# Patient Record
Sex: Male | Born: 1975 | Race: Black or African American | Hispanic: No | Smoking: Current some day smoker
Health system: Southern US, Community
[De-identification: ages and names within clinical notes are randomized; demographics above are authoritative.]

## PROBLEM LIST (undated history)

## (undated) HISTORY — PX: NO PAST SURGERIES: SHX2092

---

## 2000-10-25 ENCOUNTER — Emergency Department (HOSPITAL_COMMUNITY): Admission: EM | Admit: 2000-10-25 | Discharge: 2000-10-25 | Payer: Self-pay | Admitting: Emergency Medicine

## 2010-09-22 ENCOUNTER — Emergency Department (HOSPITAL_COMMUNITY)
Admission: EM | Admit: 2010-09-22 | Discharge: 2010-09-22 | Disposition: A | Discharge status: Home / Self Care | Payer: Worker's Compensation | Attending: Emergency Medicine | Admitting: Emergency Medicine

## 2010-09-22 DIAGNOSIS — Y990 Civilian activity done for income or pay: Secondary | ICD-10-CM | POA: Insufficient documentation

## 2010-09-22 DIAGNOSIS — W268XXA Contact with other sharp object(s), not elsewhere classified, initial encounter: Secondary | ICD-10-CM | POA: Insufficient documentation

## 2010-09-22 DIAGNOSIS — S61209A Unspecified open wound of unspecified finger without damage to nail, initial encounter: Secondary | ICD-10-CM | POA: Insufficient documentation

## 2010-09-29 ENCOUNTER — Emergency Department (HOSPITAL_COMMUNITY)
Admission: EM | Admit: 2010-09-29 | Discharge: 2010-09-29 | Disposition: A | Discharge status: Home / Self Care | Payer: Self-pay | Attending: Emergency Medicine | Admitting: Emergency Medicine

## 2010-09-29 DIAGNOSIS — Z4802 Encounter for removal of sutures: Secondary | ICD-10-CM | POA: Insufficient documentation

## 2016-07-12 ENCOUNTER — Emergency Department (HOSPITAL_COMMUNITY)
Admission: EM | Admit: 2016-07-12 | Discharge: 2016-07-12 | Disposition: A | Discharge status: Home / Self Care | Payer: BLUE CROSS/BLUE SHIELD | Attending: Emergency Medicine | Admitting: Emergency Medicine

## 2016-07-12 ENCOUNTER — Encounter (HOSPITAL_COMMUNITY): Payer: Self-pay | Admitting: Emergency Medicine

## 2016-07-12 DIAGNOSIS — M545 Low back pain, unspecified: Secondary | ICD-10-CM

## 2016-07-12 DIAGNOSIS — X509XXA Other and unspecified overexertion or strenuous movements or postures, initial encounter: Secondary | ICD-10-CM | POA: Insufficient documentation

## 2016-07-12 DIAGNOSIS — Y999 Unspecified external cause status: Secondary | ICD-10-CM | POA: Diagnosis not present

## 2016-07-12 DIAGNOSIS — Y9389 Activity, other specified: Secondary | ICD-10-CM | POA: Diagnosis not present

## 2016-07-12 DIAGNOSIS — Y929 Unspecified place or not applicable: Secondary | ICD-10-CM | POA: Diagnosis not present

## 2016-07-12 MED ORDER — IBUPROFEN 800 MG PO TABS: 800.0000 mg | ORAL_TABLET | Freq: Three times a day (TID) | ORAL | 0 refills | Status: DC

## 2016-07-12 MED ORDER — LIDOCAINE 5 % EX PTCH: 1.0000 | MEDICATED_PATCH | CUTANEOUS | 0 refills | Status: DC

## 2016-07-12 MED ORDER — METHOCARBAMOL 500 MG PO TABS: 500.0000 mg | ORAL_TABLET | Freq: Two times a day (BID) | ORAL | 0 refills | Status: DC

## 2016-07-12 NOTE — ED Notes (Signed)
Pt asked for water. Pt informed that he has to wait to see a Provider.

## 2016-07-12 NOTE — ED Provider Notes (Signed)
[facility]-EMERGENCY DEPT Provider Note   CSN: [id number] Arrival date & time: 07/12/16  1021  By signing my name below, I, [name], attest that this documentation has been prepared under the direction and in the presence of [name], PA-C. Electronically Signed: [name], Neurosurgeon. 07/12/16. 12:31 PM.  History   Chief Complaint Chief Complaint  Patient presents with  . Back Pain    The history is provided by the patient. No language interpreter was used.     HPI Comments: Noah Hardin is a 41 y.o. male who presents to the Emergency Department complaining of right lower back pain that has gradually worsened over the last few weeks. He works for [employer] and believes he initially injured his back lifting a heavy box. He states since then he has had several long distance car rides that he believes has worsened his back pain. He describes the pain as tightness in nature. Patient has tried occasional ibuprofen as well as cold and hot therapy. Ibuprofen offered some relief, however, patient states he does not like taking medications.  Patient also endorses intermittent cough as well as congestion for the past few days. He denies urinary complaints, shortness of breath, chest pain changes in bowel or bladder function, neuro deficits, or any other complaints.   He denies DM, alcoholism, HIV, IV drug use, history of spinal procedures, or history of skin infection.  History reviewed. No pertinent past medical history.  There are no active problems to display for this patient.   History reviewed. No pertinent surgical history.     Home Medications    Prior to Admission medications   Medication Sig Start Date End Date Taking? Authorizing Provider  ibuprofen (ADVIL,MOTRIN) 800 MG tablet Take 1 tablet (800 mg total) by mouth 3 (three) times daily. 07/12/16   Alonda Weaber C Lewie Deman, PA-C  lidocaine (LIDODERM) 5 % Place 1 patch onto the skin daily. Remove & Discard patch within 12 hours or as directed by MD  07/12/16   Anselm Pancoast, PA-C  methocarbamol (ROBAXIN) 500 MG tablet Take 1 tablet (500 mg total) by mouth 2 (two) times daily. 07/12/16   Anselm Pancoast, PA-C    Family History History reviewed. No pertinent family history.  Social History Social History  Substance Use Topics  . Smoking status: Not on file  . Smokeless tobacco: Not on file  . Alcohol use Not on file     Allergies   Patient has no known allergies.   Review of Systems Review of Systems  HENT: Positive for congestion.   Respiratory: Positive for cough. Negative for shortness of breath.   Gastrointestinal: Negative for nausea and vomiting.  Musculoskeletal: Positive for back pain. Negative for neck pain and neck stiffness.  Skin: Negative for rash.  Neurological: Negative for weakness, numbness and headaches.  All other systems reviewed and are negative.    Physical Exam Updated Vital Signs BP 140/97 (BP Location: Left Arm)   Pulse 99   Temp 100.6 F (38.1 C) (Oral)   Resp 18   Ht 6\' 1"  (1.854 m)   Wt 160 lb (72.6 kg)   SpO2 100%   BMI 21.11 kg/m   Physical Exam  Constitutional: He appears well-developed and well-nourished. No distress.  Patient is smiling and talkative.  HENT:  Head: Normocephalic and atraumatic.  Mouth/Throat: Oropharynx is clear and moist.  Eyes: Conjunctivae are normal.  Neck: Normal range of motion. Neck supple.  No meningismus signs.  Cardiovascular: Normal rate, regular rhythm, normal  heart sounds and intact distal pulses.   Pulmonary/Chest: Effort normal and breath sounds normal. No respiratory distress.  Abdominal: Soft. There is no tenderness. There is no guarding.  Musculoskeletal: Normal range of motion. He exhibits tenderness. He exhibits no edema.  Tenderness to the right lumbar musculature into the right sacral musculature and buttocks. Normal motor function intact in all extremities and spine. No midline spinal tenderness.    Lymphadenopathy:    He has no cervical  adenopathy.  Neurological: He is alert.  No sensory deficits. Strength 5/5 in all extremities. No gait disturbance. Coordination intact including heel to shin and finger to nose.   Skin: Skin is warm and dry. No rash noted. He is not diaphoretic. No erythema.  Patient skin checked for sources of possible infection with no abnormalities found.  Psychiatric: He has a normal mood and affect. His behavior is normal.  Nursing note and vitals reviewed.    ED Treatments / Results  DIAGNOSTIC STUDIES: Oxygen Saturation is 100% on RA, nml by my interpretation.    COORDINATION OF CARE: 12:34 PM Discussed treatment plan with pt at bedside and pt agreed to plan.    Labs (all labs ordered are listed, but only abnormal results are displayed) Labs Reviewed - No data to display  EKG  EKG Interpretation None       Radiology No results found.  Procedures Procedures (including critical care time)  Medications Ordered in ED Medications - No data to display   Initial Impression / Assessment and Plan / ED Course  I have reviewed the triage vital signs and the nursing notes.  Pertinent labs & imaging results that were available during my care of the patient were reviewed by me and considered in my medical decision making (see chart for details).  Clinical Course     Patient presents with lower back pain linked to a possible lifting injury. Patient's symptoms are consistent with muscle strain. He has no functional or neuro deficits. Very low suspicion for epidural abscess due to lack of risk factors and lack of supportive findings on physical exam. Presence of the patient's fever is odd, but may be connected with his URI symptoms. The fever was discussed with the patient. He declined medication in the ED for fever control. He states he is comfortable with managing his fever at home. He is given instructions for home care of his symptoms as well as strict return precautions. Patient voices  understanding of all instructions and is comfortable with discharge.  Vitals:   07/12/16 1052 07/12/16 1246  BP: 140/97 132/67  Pulse: 99 102  Resp: 18 16  Temp: 100.6 F (38.1 C) 101.7 F (38.7 C)  TempSrc: Oral Oral  SpO2: 100% 100%  Weight: 72.6 kg   Height: 6\' 1"  (1.854 m)      Final Clinical Impressions(s) / ED Diagnoses   Final diagnoses:  Acute bilateral low back pain without sciatica    New Prescriptions Discharge Medication List as of 07/12/2016 12:37 PM    START taking these medications   Details  ibuprofen (ADVIL,MOTRIN) 800 MG tablet Take 1 tablet (800 mg total) by mouth 3 (three) times daily., Starting Mon 07/12/2016, Print    lidocaine (LIDODERM) 5 % Place 1 patch onto the skin daily. Remove & Discard patch within 12 hours or as directed by MD, Starting Mon 07/12/2016, Print    methocarbamol (ROBAXIN) 500 MG tablet Take 1 tablet (500 mg total) by mouth 2 (two) times daily., Starting Mon 07/12/2016,  Print       I personally performed the services described in this documentation, which was scribed in my presence. The recorded information has been reviewed and is accurate.    Anselm Pancoast, PA-C 07/13/16 1610    Canary Brim Tegeler, MD 07/13/16 [phone]

## 2016-07-12 NOTE — ED Triage Notes (Signed)
Pt from home with c/o lower back pain after [place] [facility] [place] [place] his grandmother was sick x 3 weeks ago.  Pt has tried some OTC NSAIDS but does not like taking pain medication and has tried both hot and cold therapy.  Pt denies bowel or bladder incontinence.  Ambulatory, NAD, A&O.

## 2016-07-12 NOTE — Discharge Instructions (Signed)
Take it easy, but do not lay around too much as this may make any stiffness worse. Take 500 mg of naproxen every 12 hours or 800 mg of ibuprofen every 8 hours for the next 3 days. Take these medications with food to avoid upset stomach. Lidocaine patches, either prescribed or over-the-counter, may be helpful to relieve pain at the source. An example of over-the-counter lidocaine patches are those from [employer]. Robaxin is a muscle relaxer and may help loosen stiff muscles. Do not take the Robaxin while driving or performing other dangerous activities. Be sure to perform the attached exercises starting with three times a week and working up to performing them daily. This is an essential part of preventing long term problems. Follow up with a primary care provider for any future management of these complaints.

## 2016-07-12 NOTE — ED Notes (Signed)
Gave pt water, per Shawn - PA. 

## 2016-07-13 NOTE — ED Provider Notes (Signed)
Called the patient to check on how he is feeling today. Yesterday, I suspected that the patient's fever was due to a developing URI or similar infection and not connected with his back pain.  Pt states his back pain feels much better after the conservative management and medications we discussed yesterday. He now endorses sore throat and continued cough. In anticipation of these symptoms developing, I discussed symptomatic management for them yesterday. He states he has been following the instructions for symptomatic care of these symptoms voices temporary improvement.  Return precautions were updated and reiterated to included worsening sore throat with fever, shortness of breath, or worsening cough as he may need a rapid strep test or chest xray performed. Patient voices understanding of these instructions.   Noah PancoastShawn C Alvey Brockel, PA-C 07/13/16 1643    Heide Scaleshristopher J Tegeler, MD 07/13/16 [phone]

## 2019-05-14 ENCOUNTER — Ambulatory Visit (INDEPENDENT_AMBULATORY_CARE_PROVIDER_SITE_OTHER): Payer: BC Managed Care – PPO | Admitting: Family Medicine

## 2019-05-14 ENCOUNTER — Encounter: Payer: Self-pay | Admitting: Family Medicine

## 2019-05-14 ENCOUNTER — Other Ambulatory Visit: Payer: Self-pay

## 2019-05-14 VITALS — BP 136/84 | HR 67 | Temp 98.4°F | Ht 72.75 in | Wt 154.2 lb

## 2019-05-14 DIAGNOSIS — Z23 Encounter for immunization: Secondary | ICD-10-CM

## 2019-05-14 DIAGNOSIS — L84 Corns and callosities: Secondary | ICD-10-CM | POA: Insufficient documentation

## 2019-05-14 DIAGNOSIS — G8929 Other chronic pain: Secondary | ICD-10-CM

## 2019-05-14 DIAGNOSIS — M545 Low back pain, unspecified: Secondary | ICD-10-CM | POA: Insufficient documentation

## 2019-05-14 NOTE — Progress Notes (Signed)
Subjective:     Noah Hardin is a 43 y.o. male presenting for Establish Care (previous PCP-it has been years per patient) and Back Pain (off and on x a few months. No injury or trauma that he knows in particular but he does a lot of lifting at work.)     HPI   #Back pain - lower back pain - can come out of nowhere - works midnight shift at [employer] - lifting boxes - had a sharp pain in the shoulder - posterior shoulder - no pain currently - deals with the heavy equipment items - does appropriate lifting technique - symptoms typically resolve within a few days - Treatment: tries to "deal" with it, may take advil if very bad  Did have muscle relaxant in the past but never took it  Denies tingling, numbness, or weakness  #tobacco use - only smoking a cig every few days  #Callus - has been removing with razor blade every once in a while - would like to see podiatry - does not think it is a wart, but becomes painful when elevated  Review of Systems  Gastrointestinal: Negative.   Genitourinary: Negative for difficulty urinating.  Musculoskeletal: Positive for back pain. Negative for gait problem.  Neurological: Negative for numbness.     Social History   Tobacco Use  Smoking Status Current Some Day Smoker  . Packs/day: 0.25  . Years: 20.00  . Pack years: 5.00  . Types: Cigarettes  Smokeless Tobacco Never Used        Objective:    BP Readings from Last 3 Encounters:  05/14/19 136/84  07/12/16 132/67   Wt Readings from Last 3 Encounters:  05/14/19 154 lb 4 oz (70 kg)  07/12/16 160 lb (72.6 kg)    BP 136/84   Pulse 67   Temp 98.4 F (36.9 C)   Ht 6' 0.75" (1.848 m)   Wt 154 lb 4 oz (70 kg)   SpO2 97%   BMI 20.49 kg/m    Physical Exam Constitutional:      Appearance: Normal appearance. He is not ill-appearing or diaphoretic.  HENT:     Right Ear: External ear normal.     Left Ear: External ear normal.     Nose: Nose normal.  Eyes:     General:  No scleral icterus.    Extraocular Movements: Extraocular movements intact.     Conjunctiva/sclera: Conjunctivae normal.  Neck:     Musculoskeletal: Neck supple.  Cardiovascular:     Rate and Rhythm: Normal rate.  Pulmonary:     Effort: Pulmonary effort is normal.  Musculoskeletal:     Comments: Back:  Inspection: no swelling or deformity Palpation: no spinous or paraspinous TTP, notes some right sided shoulder blade TTP ROM: normal, swift movement of back and b/l arms Strength: normal UE and LE Reflexes: normal Neg straight leg raise  Skin:    General: Skin is warm and dry.     Comments: Dorsal surface of foot with removed lesion. Skin smooth but discolored.   Neurological:     Mental Status: He is alert. Mental status is at baseline.  Psychiatric:        Mood and Affect: Mood normal.           Assessment & Plan:   Problem List Items Addressed This Visit      Musculoskeletal and Integument   Callus of foot    Not visible on exam today, but pt notes history of  frequent self care. Referral to podiatry for further management.       Relevant Orders   Ambulatory referral to Podiatry     Other   Chronic right-sided low back pain without sciatica - Primary    No pain currently. Encouraged continued use of NSAID prn. Offered PT referral and pt will consider. Encouraged core strengthening and back stretching routine and posterior shoulder strengthening      Relevant Orders   Ambulatory referral to Physical Therapy       Return in about 3 months (around 08/14/2019), or if symptoms worsen or fail to improve, for for annual.  Lesleigh Noe, MD

## 2019-05-14 NOTE — Patient Instructions (Addendum)
#   Shoulder pain - Generally - work on strengthening posterior shoulder and stretching the front  # Back pain - Work on Forensic psychologist the low back   Covid Testing Location options:   [place] options:  [place] - M-F 10-3pm (closed on Christmas Day) [place] to find locations near you: [phone]

## 2019-05-14 NOTE — Assessment & Plan Note (Signed)
Not visible on exam today, but pt notes history of frequent self care. Referral to podiatry for further management.

## 2019-05-14 NOTE — Assessment & Plan Note (Signed)
No pain currently. Encouraged continued use of NSAID prn. Offered PT referral and pt will consider. Encouraged core strengthening and back stretching routine and posterior shoulder strengthening

## 2019-05-28 ENCOUNTER — Encounter: Payer: Self-pay | Admitting: Family Medicine

## 2019-06-21 ENCOUNTER — Ambulatory Visit: Payer: BC Managed Care – PPO | Attending: Internal Medicine

## 2019-06-21 DIAGNOSIS — Z20822 Contact with and (suspected) exposure to covid-19: Secondary | ICD-10-CM

## 2019-06-22 LAB — NOVEL CORONAVIRUS, NAA: SARS-CoV-2, NAA: NOT DETECTED

## 2019-07-13 ENCOUNTER — Ambulatory Visit: Payer: BC Managed Care – PPO | Attending: Internal Medicine

## 2019-07-13 DIAGNOSIS — Z20822 Contact with and (suspected) exposure to covid-19: Secondary | ICD-10-CM

## 2019-07-14 LAB — NOVEL CORONAVIRUS, NAA: SARS-CoV-2, NAA: NOT DETECTED

## 2019-08-14 ENCOUNTER — Ambulatory Visit (INDEPENDENT_AMBULATORY_CARE_PROVIDER_SITE_OTHER): Payer: BC Managed Care – PPO | Admitting: Family Medicine

## 2019-08-14 ENCOUNTER — Other Ambulatory Visit: Payer: Self-pay

## 2019-08-14 ENCOUNTER — Encounter: Payer: Self-pay | Admitting: Family Medicine

## 2019-08-14 VITALS — BP 114/72 | HR 76 | Temp 98.3°F | Resp 14 | Ht 73.0 in | Wt 153.8 lb

## 2019-08-14 DIAGNOSIS — Z13220 Encounter for screening for lipoid disorders: Secondary | ICD-10-CM | POA: Diagnosis not present

## 2019-08-14 DIAGNOSIS — Z0000 Encounter for general adult medical examination without abnormal findings: Secondary | ICD-10-CM | POA: Diagnosis not present

## 2019-08-14 DIAGNOSIS — Z131 Encounter for screening for diabetes mellitus: Secondary | ICD-10-CM | POA: Diagnosis not present

## 2019-08-14 NOTE — Patient Instructions (Signed)
Preventive Care 61-44 Years Old, Male Preventive care refers to lifestyle choices and visits with your health care provider that can promote health and wellness. This includes:  A yearly physical exam. This is also called an annual well check.  Regular dental and eye exams.  Immunizations.  Screening for certain conditions.  Healthy lifestyle choices, such as eating a healthy diet, getting regular exercise, not using drugs or products that contain nicotine and tobacco, and limiting alcohol use. What can I expect for my preventive care visit? Physical exam Your health care provider will check:  Height and weight. These may be used to calculate body mass index (BMI), which is a measurement that tells if you are at a healthy weight.  Heart rate and blood pressure.  Your skin for abnormal spots. Counseling Your health care provider may ask you questions about:  Alcohol, tobacco, and drug use.  Emotional well-being.  Home and relationship well-being.  Sexual activity.  Eating habits.  Work and work Statistician. What immunizations do I need?  Influenza (flu) vaccine  This is recommended every year. Tetanus, diphtheria, and pertussis (Tdap) vaccine  You may need a Td booster every 10 years. Varicella (chickenpox) vaccine  You may need this vaccine if you have not already been vaccinated. Zoster (shingles) vaccine  You may need this after age 30. Measles, mumps, and rubella (MMR) vaccine  You may need at least one dose of MMR if you were born in 1957 or later. You may also need a second dose. Pneumococcal conjugate (PCV13) vaccine  You may need this if you have certain conditions and were not previously vaccinated. Pneumococcal polysaccharide (PPSV23) vaccine  You may need one or two doses if you smoke cigarettes or if you have certain conditions. Meningococcal conjugate (MenACWY) vaccine  You may need this if you have certain conditions. Hepatitis A  vaccine  You may need this if you have certain conditions or if you travel or work in places where you may be exposed to hepatitis A. Hepatitis B vaccine  You may need this if you have certain conditions or if you travel or work in places where you may be exposed to hepatitis B. Haemophilus influenzae type b (Hib) vaccine  You may need this if you have certain risk factors. Human papillomavirus (HPV) vaccine  If recommended by your health care provider, you may need three doses over 6 months. You may receive vaccines as individual doses or as more than one vaccine together in one shot (combination vaccines). Talk with your health care provider about the risks and benefits of combination vaccines. What tests do I need? Blood tests  Lipid and cholesterol levels. These may be checked every 5 years, or more frequently if you are over 17 years old.  Hepatitis C test.  Hepatitis B test. Screening  Lung cancer screening. You may have this screening every year starting at age 39 if you have a 30-pack-year history of smoking and currently smoke or have quit within the past 15 years.  Prostate cancer screening. Recommendations will vary depending on your family history and other risks.  Colorectal cancer screening. All adults should have this screening starting at age 32 and continuing until age 39. Your health care provider may recommend screening at age 84 if you are at increased risk. You will have tests every 1-10 years, depending on your results and the type of screening test.  Diabetes screening. This is done by checking your blood sugar (glucose) after you have not eaten  for a while (fasting). You may have this done every 1-3 years.  Sexually transmitted disease (STD) testing. Follow these instructions at home: Eating and drinking  Eat a diet that includes fresh fruits and vegetables, whole grains, lean protein, and low-fat dairy products.  Take vitamin and mineral supplements as  recommended by your health care provider.  Do not drink alcohol if your health care provider tells you not to drink.  If you drink alcohol: ? Limit how much you have to 0-2 drinks a day. ? Be aware of how much alcohol is in your drink. In the [place] one drink equals one 12 oz bottle of beer (355 mL), one 5 oz glass of wine (148 mL), or one 1 oz glass of hard liquor (44 mL). Lifestyle  Take daily care of your teeth and gums.  Stay active. Exercise for at least 30 minutes on 5 or more days each week.  Do not use any products that contain nicotine or tobacco, such as cigarettes, e-cigarettes, and chewing tobacco. If you need help quitting, ask your health care provider.  If you are sexually active, practice safe sex. Use a condom or other form of protection to prevent STIs (sexually transmitted infections).  Talk with your health care provider about taking a low-dose aspirin every day starting at age 84. What's next?  Go to your health care provider once a year for a well check visit.  Ask your health care provider how often you should have your eyes and teeth checked.  Stay up to date on all vaccines. This information is not intended to replace advice given to you by your health care provider. Make sure you discuss any questions you have with your health care provider. Document Revised: 06/08/2018 Document Reviewed: 06/08/2018 Elsevier Patient Education  2020 [employer].

## 2019-08-14 NOTE — Progress Notes (Signed)
Annual Exam   Chief Complaint:  Chief Complaint  Patient presents with  . Annual Exam    History of Present Illness:  Noah Hardin is a 44 y.o. presents today for annual examination.     Nutrition/Lifestyle Diet: no red meat, chicken/fish Exercise: every other day - strength training He is single partner, contraception - condoms most of the time.   Social History   Tobacco Use  Smoking Status Current Some Day Smoker  . Packs/day: 0.25  . Years: 20.00  . Pack years: 5.00  . Types: Cigarettes  Smokeless Tobacco Never Used  Tobacco Comment   not every day, working on quitting   Social History   Substance and Sexual Activity  Alcohol Use Yes   Comment: 1 beer a day   Social History   Substance and Sexual Activity  Drug Use Not Currently  . Types: Marijuana   Comment: quit October 2020     Safety The patient wears seatbelts: yes.     The patient feels safe at home and in their relationships: yes.  General Health Dentist in the last year: No - needs to make an appointment Eye doctor: yes   Weight Wt Readings from Last 3 Encounters:  08/14/19 153 lb 12 oz (69.7 kg)  05/14/19 154 lb 4 oz (70 kg)  07/12/16 160 lb (72.6 kg)   Patient has normal BMI  BMI Readings from Last 1 Encounters:  08/14/19 20.28 kg/m     Chronic disease screening Blood pressure monitoring:  BP Readings from Last 3 Encounters:  08/14/19 114/72  05/14/19 136/84  07/12/16 132/67    Lipid Monitoring: Indication for screening: age >35, obesity, diabetes, family hx, CV risk factors.  Lipid screening: Yes  No results found for: CHOL, HDL, LDLCALC, LDLDIRECT, TRIG, CHOLHDL   Diabetes Screening: age >62, overweight, family hx, PCOS, hx of gestational diabetes, at risk ethnicity, elevated blood pressure >135/80.  Diabetes Screening screening: Yes  No results found for: HGBA1C   Immunization History  Administered Date(s) Administered  . Tdap 05/14/2019    No past medical  history on file.  Past Surgical History:  Procedure Laterality Date  . NO PAST SURGERIES      Prior to Admission medications   Medication Sig Start Date End Date Taking? Authorizing Provider  ibuprofen (ADVIL,MOTRIN) 800 MG tablet Take 1 tablet (800 mg total) by mouth 3 (three) times daily. 07/12/16  Yes Joy, Shawn C, PA-C  Omega-3 Fatty Acids (FISH OIL) 1000 MG CAPS Take by mouth.   Yes [provider]    No Known Allergies   Social History   Socioeconomic History  . Marital status: Single    Spouse name: Not on file  . Number of children: 1  . Years of education: High school  . Highest education level: Not on file  Occupational History  . Not on file  Tobacco Use  . Smoking status: Current Some Day Smoker    Packs/day: 0.25    Years: 20.00    Pack years: 5.00    Types: Cigarettes  . Smokeless tobacco: Never Used  . Tobacco comment: not every day, working on quitting  Substance and Sexual Activity  . Alcohol use: Yes    Comment: 1 beer a day  . Drug use: Not Currently    Types: Marijuana    Comment: quit October 2020  . Sexual activity: Yes    Birth control/protection: Condom  Other Topics Concern  . Not on file  Social History  Narrative   05/14/19   From: born in [place] and then [place] and then [place]   Living: with daughter Arlana Hove) and his girlfriend Engineer, structural)   Work: for [employer]      Family: Arlana Hove is 44 yo      Enjoys: play music - guitar, and DJ skills      Exercise: occasional strength training, has not been to the gym since covid   Diet: no red meat, chicken and fish, veggies occasionally      Safety   Seat belts: Yes    Guns: Yes  and secure   Safe in relationships: Yes    Social Determinants of Health   Financial Resource Strain: Low Risk   . Difficulty of Paying Living Expenses: Not hard at all  Food Insecurity:   . Worried About Programme researcher, broadcasting/film/video in the Last Year: Not on file  . Ran Out of Food in the Last Year: Not on file  Transportation  Needs:   . Lack of Transportation (Medical): Not on file  . Lack of Transportation (Non-Medical): Not on file  Physical Activity:   . Days of Exercise per Week: Not on file  . Minutes of Exercise per Session: Not on file  Stress:   . Feeling of Stress : Not on file  Social Connections:   . Frequency of Communication with Friends and Family: Not on file  . Frequency of Social Gatherings with Friends and Family: Not on file  . Attends Religious Services: Not on file  . Active Member of Clubs or Organizations: Not on file  . Attends Banker Meetings: Not on file  . Marital Status: Not on file  Intimate Partner Violence:   . Fear of Current or Ex-Partner: Not on file  . Emotionally Abused: Not on file  . Physically Abused: Not on file  . Sexually Abused: Not on file    Family History  Problem Relation Age of Onset  . Hypertension Mother   . Hypertension Father     Review of Systems  Constitutional: Negative for chills and fever.  HENT: Negative for congestion and sore throat.   Eyes: Negative for blurred vision and double vision.  Respiratory: Negative for shortness of breath.   Cardiovascular: Negative for chest pain.  Gastrointestinal: Negative for heartburn, nausea and vomiting.  Genitourinary: Negative.   Musculoskeletal: Negative.  Negative for myalgias.  Skin: Negative for rash.  Neurological: Negative for dizziness and headaches.  Endo/Heme/Allergies: Does not bruise/bleed easily.  Psychiatric/Behavioral: Negative for depression. The patient is not nervous/anxious.      Physical Exam BP 114/72   Pulse 76   Temp 98.3 F (36.8 C)   Resp 14   Ht 6\' 1"  (1.854 m)   Wt 153 lb 12 oz (69.7 kg)   BMI 20.28 kg/m    BP Readings from Last 3 Encounters:  08/14/19 114/72  05/14/19 136/84  07/12/16 132/67      Physical Exam Constitutional:      General: He is not in acute distress.    Appearance: Normal appearance. He is well-developed. He is not  ill-appearing or diaphoretic.  HENT:     Head: Normocephalic and atraumatic.     Right Ear: Tympanic membrane, ear canal and external ear normal.     Left Ear: Tympanic membrane, ear canal and external ear normal.     Nose: Nose normal.     Mouth/Throat:     Pharynx: Uvula midline.  Eyes:  General: No scleral icterus.    Extraocular Movements: Extraocular movements intact.     Conjunctiva/sclera: Conjunctivae normal.     Pupils: Pupils are equal, round, and reactive to light.  Cardiovascular:     Rate and Rhythm: Normal rate and regular rhythm.     Heart sounds: Normal heart sounds. No murmur.  Pulmonary:     Effort: Pulmonary effort is normal. No respiratory distress.     Breath sounds: Normal breath sounds. No wheezing.  Abdominal:     General: Bowel sounds are normal. There is no distension.     Palpations: Abdomen is soft. There is no mass.     Tenderness: There is no abdominal tenderness. There is no guarding.  Musculoskeletal:        General: Normal range of motion.     Cervical back: Normal range of motion and neck supple.  Lymphadenopathy:     Cervical: No cervical adenopathy.  Skin:    General: Skin is warm and dry.     Capillary Refill: Capillary refill takes less than 2 seconds.  Neurological:     Mental Status: He is alert and oriented to person, place, and time. Mental status is at baseline.  Psychiatric:        Mood and Affect: Mood normal.        Results: Depression screen PHQ 2/9 05/14/2019  Decreased Interest 0  Down, Depressed, Hopeless 0  PHQ - 2 Score 0      Assessment: 44 y.o. here for routine annual physical examination.  Plan: Problem List Items Addressed This Visit    None    Visit Diagnoses    Annual physical exam    -  Primary   Relevant Orders   Comprehensive metabolic panel   Lipid panel   Screening for hyperlipidemia       Relevant Orders   Lipid panel   Screening for diabetes mellitus       Relevant Orders    Comprehensive metabolic panel      Screening: -- Blood pressure screen normal -- cholesterol screening: will obtain -- Weight screening: normal -- Diabetes Screening: will obtain -- Nutrition: normal - encouraged healthy eating  The ASCVD Risk score Mikey Bussing DC Jr., et al., 2013) failed to calculate for the following reasons:   Cannot find a previous HDL lab   Cannot find a previous total cholesterol lab  -- Statin therapy for Age 34-75 with CVD risk >7.5%  Psych -- Depression screening (PHQ-9): low risk   Safety -- tobacco screening: encouraged cessation, only smoking some days currently -- alcohol screening:  low-risk usage. -- no evidence of domestic violence or intimate partner violence.   Cancer Screening -- No age related cancer screening due  Immunizations -- flu vaccine up to date -- TDAP q10 years up to date   Encouraged regular vision and dental care. As well as sun protection    Lesleigh Noe

## 2019-08-15 ENCOUNTER — Encounter: Payer: Self-pay | Admitting: Family Medicine

## 2019-08-15 LAB — COMPREHENSIVE METABOLIC PANEL
ALT: 10 U/L (ref 0–53)
AST: 16 U/L (ref 0–37)
Albumin: 4.3 g/dL (ref 3.5–5.2)
Alkaline Phosphatase: 61 U/L (ref 39–117)
BUN: 16 mg/dL (ref 6–23)
CO2: 31 mEq/L (ref 19–32)
Calcium: 9.8 mg/dL (ref 8.4–10.5)
Chloride: 102 mEq/L (ref 96–112)
Creatinine, Ser: 1.23 mg/dL (ref 0.40–1.50)
GFR: 77.36 mL/min (ref >60.00)
Glucose, Bld: 83 mg/dL (ref 70–99)
Potassium: 4.7 mEq/L (ref 3.5–5.1)
Sodium: 138 mEq/L (ref 135–145)
Total Bilirubin: 0.7 mg/dL (ref 0.2–1.2)
Total Protein: 6.7 g/dL (ref 6.0–8.3)

## 2019-08-15 LAB — LIPID PANEL
Cholesterol: 191 mg/dL (ref 0–200)
HDL: 76.5 mg/dL (ref >39.00)
LDL Cholesterol: 98 mg/dL (ref 0–99)
NonHDL: 114.04
Total CHOL/HDL Ratio: 2
Triglycerides: 81 mg/dL (ref 0.0–149.0)
VLDL: 16.2 mg/dL (ref 0.0–40.0)

## 2019-08-29 ENCOUNTER — Telehealth: Payer: Self-pay

## 2019-08-29 NOTE — Telephone Encounter (Signed)
Received notification that patient has not read his mychart message from Dr Selena Batten about his lab results. I called patient but it went straight to voicemail that was not set up. I called patient/s mom-ok per DPR on file, and asked to have patient call us back.

## 2019-08-29 NOTE — Telephone Encounter (Signed)
Patient called back and was advised of lab results. Verified his phone number was correct

## 2019-09-13 ENCOUNTER — Telehealth: Payer: Self-pay

## 2019-09-13 NOTE — Telephone Encounter (Signed)
Noted  

## 2019-09-13 NOTE — Telephone Encounter (Signed)
Referral closed.  See Referral Notes/hx.  Pt has not responded to phone calls or Unable to Contact letter.  

## 2019-09-29 ENCOUNTER — Ambulatory Visit: Payer: BC Managed Care – PPO

## 2019-10-04 ENCOUNTER — Ambulatory Visit: Payer: BC Managed Care – PPO | Attending: Internal Medicine

## 2019-10-04 DIAGNOSIS — Z23 Encounter for immunization: Secondary | ICD-10-CM

## 2019-10-04 NOTE — Progress Notes (Signed)
   Covid-19 Vaccination Clinic  Name:  Noah Hardin    MRN: [id number] DOB: 02/28/1976  10/04/2019  Noah Hardin was observed post Covid-19 immunization for 15 minutes without incident. He was provided with Vaccine Information Sheet and instruction to access the V-Safe system.   Noah Hardin was instructed to call 911 with any severe reactions post vaccine: Marland Kitchen Difficulty breathing  . Swelling of face and throat  . A fast heartbeat  . A bad rash all over body  . Dizziness and weakness   Immunizations Administered    Name Date Dose VIS Date Route   Pfizer COVID-19 Vaccine 10/04/2019  2:32 PM 0.3 mL 06/08/2019 Intramuscular   Manufacturer: [employer], [employer]   Lot: [id number]   NDC: [address]-0

## 2019-10-29 ENCOUNTER — Ambulatory Visit: Payer: BC Managed Care – PPO | Attending: Internal Medicine

## 2019-10-29 DIAGNOSIS — Z23 Encounter for immunization: Secondary | ICD-10-CM

## 2019-10-29 NOTE — Progress Notes (Signed)
   Covid-19 Vaccination Clinic  Name:  Noah Hardin    MRN: [id number] DOB: Mar 03, 1976  10/29/2019  Mr. Noah Hardin was observed post Covid-19 immunization for 15 minutes without incident. He was provided with Vaccine Information Sheet and instruction to access the V-Safe system.   Mr. Noah Hardin was instructed to call 911 with any severe reactions post vaccine: Marland Kitchen Difficulty breathing  . Swelling of face and throat  . A fast heartbeat  . A bad rash all over body  . Dizziness and weakness   Immunizations Administered    Name Date Dose VIS Date Route   Pfizer COVID-19 Vaccine 10/29/2019  2:29 PM 0.3 mL 08/22/2018 Intramuscular   Manufacturer: [employer], [employer]   Lot: [id number]   NDC: [address]-5

## 2020-06-24 ENCOUNTER — Encounter: Payer: Self-pay | Admitting: Family Medicine

## 2020-06-24 ENCOUNTER — Ambulatory Visit: Payer: BC Managed Care – PPO | Admitting: Family Medicine

## 2020-06-24 ENCOUNTER — Other Ambulatory Visit: Payer: Self-pay

## 2020-06-24 VITALS — BP 100/60 | HR 92 | Temp 98.5°F | Ht 73.0 in | Wt 144.0 lb

## 2020-06-24 DIAGNOSIS — Z23 Encounter for immunization: Secondary | ICD-10-CM | POA: Diagnosis not present

## 2020-06-24 DIAGNOSIS — S39012A Strain of muscle, fascia and tendon of lower back, initial encounter: Secondary | ICD-10-CM | POA: Insufficient documentation

## 2020-06-24 MED ORDER — CYCLOBENZAPRINE HCL 5 MG PO TABS: 5.0000 mg | ORAL_TABLET | Freq: Three times a day (TID) | ORAL | 1 refills | Status: DC | PRN

## 2020-06-24 NOTE — Assessment & Plan Note (Signed)
Pt with hx of right side back strain. Heavy lifting at job. Encouraged NSAIDs prn. Flexeril as needed at night. Home exercises provided. He will call if wanting to try PT. Work note to stay out for 1-2 days. Discussed consider XR if not resolved in a few weeks.

## 2020-06-24 NOTE — Patient Instructions (Addendum)
Low back Pain - Advil 600-800 mg every 8 hours as needed - Can try the Flexeril at night for spasms and sleep - if not improved can do imaging or consider Physical therapy   Low Back Sprain or Strain Rehab Ask your health care provider which exercises are safe for you. Do exercises exactly as told by your health care provider and adjust them as directed. It is normal to feel mild stretching, pulling, tightness, or discomfort as you do these exercises. Stop right away if you feel sudden pain or your pain gets worse. Do not begin these exercises until told by your health care provider. Stretching and range-of-motion exercises These exercises warm up your muscles and joints and improve the movement and flexibility of your back. These exercises also help to relieve pain, numbness, and tingling. Lumbar rotation  1. Lie on your back on a firm surface and bend your knees. 2. Straighten your arms out to your sides so each arm forms a 90-degree angle (right angle) with a side of your body. 3. Slowly move (rotate) both of your knees to one side of your body until you feel a stretch in your lower back (lumbar). Try not to let your shoulders lift off the floor. 4. Hold this position for __________ seconds. 5. Tense your abdominal muscles and slowly move your knees back to the starting position. 6. Repeat this exercise on the other side of your body. Repeat __________ times. Complete this exercise __________ times a day. Single knee to chest  1. Lie on your back on a firm surface with both legs straight. 2. Bend one of your knees. Use your hands to move your knee up toward your chest until you feel a gentle stretch in your lower back and buttock. ? Hold your leg in this position by holding on to the front of your knee. ? Keep your other leg as straight as possible. 3. Hold this position for __________ seconds. 4. Slowly return to the starting position. 5. Repeat with your other leg. Repeat __________  times. Complete this exercise __________ times a day. Prone extension on elbows  1. Lie on your abdomen on a firm surface (prone position). 2. Prop yourself up on your elbows. 3. Use your arms to help lift your chest up until you feel a gentle stretch in your abdomen and your lower back. ? This will place some of your body weight on your elbows. If this is uncomfortable, try stacking pillows under your chest. ? Your hips should stay down, against the surface that you are lying on. Keep your hip and back muscles relaxed. 4. Hold this position for __________ seconds. 5. Slowly relax your upper body and return to the starting position. Repeat __________ times. Complete this exercise __________ times a day. Strengthening exercises These exercises build strength and endurance in your back. Endurance is the ability to use your muscles for a long time, even after they get tired. Pelvic tilt This exercise strengthens the muscles that lie deep in the abdomen. 1. Lie on your back on a firm surface. Bend your knees and keep your feet flat on the floor. 2. Tense your abdominal muscles. Tip your pelvis up toward the ceiling and flatten your lower back into the floor. ? To help with this exercise, you may place a small towel under your lower back and try to push your back into the towel. 3. Hold this position for __________ seconds. 4. Let your muscles relax completely before you repeat this  exercise. Repeat __________ times. Complete this exercise __________ times a day. Alternating arm and leg raises  1. Get on your hands and knees on a firm surface. If you are on a hard floor, you may want to use padding, such as an exercise mat, to cushion your knees. 2. Line up your arms and legs. Your hands should be directly below your shoulders, and your knees should be directly below your hips. 3. Lift your left leg behind you. At the same time, raise your right arm and straighten it in front of you. ? Do not  lift your leg higher than your hip. ? Do not lift your arm higher than your shoulder. ? Keep your abdominal and back muscles tight. ? Keep your hips facing the ground. ? Do not arch your back. ? Keep your balance carefully, and do not hold your breath. 4. Hold this position for __________ seconds. 5. Slowly return to the starting position. 6. Repeat with your right leg and your left arm. Repeat __________ times. Complete this exercise __________ times a day. Abdominal set with straight leg raise  1. Lie on your back on a firm surface. 2. Bend one of your knees and keep your other leg straight. 3. Tense your abdominal muscles and lift your straight leg up, 4-6 inches (10-15 cm) off the ground. 4. Keep your abdominal muscles tight and hold this position for __________ seconds. ? Do not hold your breath. ? Do not arch your back. Keep it flat against the ground. 5. Keep your abdominal muscles tense as you slowly lower your leg back to the starting position. 6. Repeat with your other leg. Repeat __________ times. Complete this exercise __________ times a day. Single leg lower with bent knees 1. Lie on your back on a firm surface. 2. Tense your abdominal muscles and lift your feet off the floor, one foot at a time, so your knees and hips are bent in 90-degree angles (right angles). ? Your knees should be over your hips and your lower legs should be parallel to the floor. 3. Keeping your abdominal muscles tense and your knee bent, slowly lower one of your legs so your toe touches the ground. 4. Lift your leg back up to return to the starting position. ? Do not hold your breath. ? Do not let your back arch. Keep your back flat against the ground. 5. Repeat with your other leg. Repeat __________ times. Complete this exercise __________ times a day. Posture and body mechanics Good posture and healthy body mechanics can help to relieve stress in your body's tissues and joints. Body mechanics  refers to the movements and positions of your body while you do your daily activities. Posture is part of body mechanics. Good posture means:  Your spine is in its natural S-curve position (neutral).  Your shoulders are pulled back slightly.  Your head is not tipped forward. Follow these guidelines to improve your posture and body mechanics in your everyday activities. Standing   When standing, keep your spine neutral and your feet about hip width apart. Keep a slight bend in your knees. Your ears, shoulders, and hips should line up.  When you do a task in which you stand in one place for a long time, place one foot up on a stable object that is 2-4 inches (5-10 cm) high, such as a footstool. This helps keep your spine neutral. Sitting   When sitting, keep your spine neutral and keep your feet flat on the  floor. Use a footrest, if necessary, and keep your thighs parallel to the floor. Avoid rounding your shoulders, and avoid tilting your head forward.  When working at a desk or a computer, keep your desk at a height where your hands are slightly lower than your elbows. Slide your chair under your desk so you are close enough to maintain good posture.  When working at a computer, place your monitor at a height where you are looking straight ahead and you do not have to tilt your head forward or downward to look at the screen. Resting  When lying down and resting, avoid positions that are most painful for you.  If you have pain with activities such as sitting, bending, stooping, or squatting, lie in a position in which your body does not bend very much. For example, avoid curling up on your side with your arms and knees near your chest (fetal position).  If you have pain with activities such as standing for a long time or reaching with your arms, lie with your spine in a neutral position and bend your knees slightly. Try the following positions: ? Lying on your side with a pillow between  your knees. ? Lying on your back with a pillow under your knees. Lifting   When lifting objects, keep your feet at least shoulder width apart and tighten your abdominal muscles.  Bend your knees and hips and keep your spine neutral. It is important to lift using the strength of your legs, not your back. Do not lock your knees straight out.  Always ask for help to lift heavy or awkward objects. This information is not intended to replace advice given to you by your health care provider. Make sure you discuss any questions you have with your health care provider. Document Revised: 10/06/2018 Document Reviewed: 07/06/2018 Elsevier Patient Education  [place].

## 2020-06-24 NOTE — Progress Notes (Signed)
Subjective:     Noah Hardin is a 44 y.o. 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 The current episode started 1 to 4 weeks ago. The problem occurs intermittently (3 times a week). The problem is unchanged. The pain is present in the lumbar spine. The quality of the pain is described as stabbing and cramping. Radiates to: travels to the shoulder once. The pain is at a severity of 5/10. The pain is moderate. The pain is the same all the time. The symptoms are aggravated by bending. Pertinent negatives include no bladder incontinence, bowel incontinence, fever, headaches, numbness, paresthesias, tingling or weakness. He has tried NSAIDs and home exercises for the symptoms. The treatment provided mild relief.    When it comes lasts at least 1/2 a day   Review of Systems  Constitutional: Negative for fever.  Gastrointestinal: Negative for bowel incontinence.  Genitourinary: Negative for bladder incontinence.  Musculoskeletal: Positive for back pain.  Neurological: Negative for tingling, weakness, numbness, headaches and paresthesias.     Social History   Tobacco Use  Smoking Status Current Some Day Smoker  . Packs/day: 0.25  . Years: 20.00  . Pack years: 5.00  . Types: Cigarettes  Smokeless Tobacco Never Used  Tobacco Comment   not every day, working on quitting        Objective:    BP Readings from Last 3 Encounters:  06/24/20 100/60  08/14/19 114/72  05/14/19 136/84   Wt Readings from Last 3 Encounters:  06/24/20 144 lb (65.3 kg)  08/14/19 153 lb 12 oz (69.7 kg)  05/14/19 154 lb 4 oz (70 kg)    BP 100/60   Pulse 92   Temp 98.5 F (36.9 C) (Temporal)   Ht 6\' 1"  (1.854 m)   Wt 144 lb (65.3 kg)   SpO2 96%   BMI 19.00 kg/m    Physical Exam Constitutional:      Appearance: Normal appearance. He is not ill-appearing or diaphoretic.  HENT:     Right Ear: External ear  normal.     Left Ear: External ear normal.  Eyes:     General: No scleral icterus.    Extraocular Movements: Extraocular movements intact.     Conjunctiva/sclera: Conjunctivae normal.  Cardiovascular:     Rate and Rhythm: Normal rate.  Pulmonary:     Effort: Pulmonary effort is normal.  Musculoskeletal:     Cervical back: Neck supple.     Comments: Back Inspection: no abnormalities, thin Palpation: TTP along the spine, more in the lumbar midline and bilateral Paraspinous ROM: normal but with pain with flexion/extension Strength: normal Reflexes: normal Straight leg raise - negative  Skin:    General: Skin is warm and dry.  Neurological:     Mental Status: He is alert. Mental status is at baseline.  Psychiatric:        Mood and Affect: Mood normal.           Assessment & Plan:   Problem List Items Addressed This Visit      Musculoskeletal and Integument   Strain of lumbar region - Primary    Pt with hx of right side back strain. Heavy lifting at job. Encouraged NSAIDs prn. Flexeril as needed at night. Home exercises provided. He will call if wanting to try PT. Work note to stay out for 1-2 days. Discussed consider XR if not resolved  in a few weeks.       Relevant Medications   cyclobenzaprine (FLEXERIL) 5 MG tablet    Other Visit Diagnoses    Need for influenza vaccination       Relevant Orders   Flu Vaccine QUAD 6+ mos PF IM (Fluarix Quad PF) (Completed)       Return in about 4 weeks (around 07/22/2020), or if symptoms worsen or fail to improve.  Lynnda Child, MD  This visit occurred during the SARS-CoV-2 public health emergency.  Safety protocols were in place, including screening questions prior to the visit, additional usage of staff PPE, and extensive cleaning of exam room while observing appropriate contact time as indicated for disinfecting solutions.

## 2020-07-07 ENCOUNTER — Encounter: Payer: Self-pay | Admitting: Family Medicine

## 2020-08-18 ENCOUNTER — Encounter: Payer: BC Managed Care – PPO | Admitting: Family Medicine

## 2020-08-25 ENCOUNTER — Encounter: Payer: BC Managed Care – PPO | Admitting: Family Medicine

## 2020-08-25 DIAGNOSIS — Z0289 Encounter for other administrative examinations: Secondary | ICD-10-CM

## 2020-09-23 ENCOUNTER — Encounter: Payer: BC Managed Care – PPO | Admitting: Family Medicine

## 2020-09-25 ENCOUNTER — Encounter: Payer: BC Managed Care – PPO | Admitting: Family Medicine

## 2020-10-06 ENCOUNTER — Ambulatory Visit (INDEPENDENT_AMBULATORY_CARE_PROVIDER_SITE_OTHER): Payer: BC Managed Care – PPO | Admitting: Family Medicine

## 2020-10-06 ENCOUNTER — Other Ambulatory Visit: Payer: Self-pay

## 2020-10-06 ENCOUNTER — Encounter: Payer: Self-pay | Admitting: Family Medicine

## 2020-10-06 VITALS — BP 118/62 | HR 78 | Temp 98.5°F | Ht 73.0 in | Wt 146.0 lb

## 2020-10-06 DIAGNOSIS — Z1211 Encounter for screening for malignant neoplasm of colon: Secondary | ICD-10-CM

## 2020-10-06 DIAGNOSIS — Z8249 Family history of ischemic heart disease and other diseases of the circulatory system: Secondary | ICD-10-CM

## 2020-10-06 DIAGNOSIS — G8929 Other chronic pain: Secondary | ICD-10-CM

## 2020-10-06 DIAGNOSIS — Z136 Encounter for screening for cardiovascular disorders: Secondary | ICD-10-CM | POA: Diagnosis not present

## 2020-10-06 DIAGNOSIS — Z720 Tobacco use: Secondary | ICD-10-CM

## 2020-10-06 DIAGNOSIS — Z13228 Encounter for screening for other metabolic disorders: Secondary | ICD-10-CM | POA: Diagnosis not present

## 2020-10-06 DIAGNOSIS — Z1159 Encounter for screening for other viral diseases: Secondary | ICD-10-CM

## 2020-10-06 DIAGNOSIS — Z0000 Encounter for general adult medical examination without abnormal findings: Secondary | ICD-10-CM | POA: Diagnosis not present

## 2020-10-06 DIAGNOSIS — Z130 Encounter for screening for diseases of the blood and blood-forming organs and certain disorders involving the immune mechanism: Secondary | ICD-10-CM

## 2020-10-06 DIAGNOSIS — M25512 Pain in left shoulder: Secondary | ICD-10-CM

## 2020-10-06 NOTE — Assessment & Plan Note (Signed)
Cousin with heart attack at 48. Discussed overall pt with no heart risk factors and first degree relatives with no MI. Discussed smoking cessation and healthy diet and routine screening but do not need additional cardiac work-up

## 2020-10-06 NOTE — Patient Instructions (Signed)
Keep eating healthy and exercing  Go to the dentist  Wear your glasses!

## 2020-10-06 NOTE — Assessment & Plan Note (Signed)
Suspect intermittent chest pain is more MSK shoulder related pain. Minimal symptoms, watch and wait.

## 2020-10-06 NOTE — Assessment & Plan Note (Signed)
Encouraged complete cessation as best thing he could do for his health

## 2020-10-06 NOTE — Progress Notes (Signed)
Annual Exam   Chief Complaint:  Chief Complaint  Patient presents with  . Annual Exam    History of Present Illness:  Noah Hardin is a 45 y.o. presents today for annual examination.    #chest soreness - will happen randomly - cousin just had a heart attack at 48 - left side - does not radiate - does not occur with activity - will last a few seconds and resolve on its own - location is left shoulder area, just below the clavicle    Nutrition/Lifestyle Diet: chicken and fish, tries to eat veggies, fried food once per week Exercise: working is active He is single partner, contraception - none.  Any issues with getting or keeping erection? No  Social History   Tobacco Use  Smoking Status Current Some Day Smoker  . Packs/day: 0.25  . Years: 20.00  . Pack years: 5.00  . Types: Cigarettes  Smokeless Tobacco Never Used  Tobacco Comment   not every day, working on quitting   Social History   Substance and Sexual Activity  Alcohol Use Yes   Comment: 1 beer a day   Social History   Substance and Sexual Activity  Drug Use Not Currently  . Types: Marijuana   Comment: a few times a year     Safety The patient wears seatbelts: yes.     The patient feels safe at home and in their relationships: yes.  General Health Dentist in the last year: No Eye doctor: no  Weight Wt Readings from Last 3 Encounters:  10/06/20 146 lb (66.2 kg)  06/24/20 144 lb (65.3 kg)  08/14/19 153 lb 12 oz (69.7 kg)   Patient has normal BMI  BMI Readings from Last 1 Encounters:  10/06/20 19.26 kg/m     Chronic disease screening Blood pressure monitoring:  BP Readings from Last 3 Encounters:  10/06/20 118/62  06/24/20 100/60  08/14/19 114/72    Lipid Monitoring: Indication for screening: age >35, obesity, diabetes, family hx, CV risk factors.  Lipid screening: Yes  Lab Results  Component Value Date   CHOL 191 08/14/2019   HDL 76.50 08/14/2019   LDLCALC 98  08/14/2019   TRIG 81.0 08/14/2019   CHOLHDL 2 08/14/2019     Diabetes Screening: age >40, overweight, family hx, PCOS, hx of gestational diabetes, at risk ethnicity, elevated blood pressure >135/80.  Diabetes Screening screening: Yes  No results found for: HGBA1C       Colon Cancer Screening:  Age 45-75 yo - benefits outweigh the risk. Adults 65-85 yo who have never been screened benefit.  Benefits: 134000 people in 2016 will be diagnosed and 49,000 will die - early detection helps Harms: Complications 2/2 to colonoscopy High Risk (Colonoscopy): genetic disorder (Lynch syndrome or familial adenomatous polyposis), personal hx of IBD, previous adenomatous polyp, or previous colorectal cancer, FamHx start 10 years before the age at diagnosis, increased in males and black race  Options:  FIT - looks for hemoglobin (blood in the stool) - specific and fairly sensitive - must be done annually Cologuard - looks for DNA and blood - more sensitive - therefore can have more false positives, every 3 years Colonoscopy - every 10 years if normal - sedation, bowl prep, must have someone drive you  Shared decision making and the patient had decided to do fobt.   Social History   Tobacco Use  Smoking Status Current Some Day Smoker  . Packs/day: 0.25  . Years: 20.00  . Pack years:  5.00  . Types: Cigarettes  Smokeless Tobacco Never Used  Tobacco Comment   not every day, working on quitting        No past medical history on file.  Past Surgical History:  Procedure Laterality Date  . NO PAST SURGERIES      Prior to Admission medications   Medication Sig Start Date End Date Taking? Authorizing Provider  Omega-3 Fatty Acids (FISH OIL) 1000 MG CAPS Take by mouth.   Yes [provider]    No Known Allergies   Social History   Socioeconomic History  . Marital status: Single    Spouse name: Not on file  . Number of children: 1  . Years of education: High school  .  Highest education level: Not on file  Occupational History  . Not on file  Tobacco Use  . Smoking status: Current Some Day Smoker    Packs/day: 0.25    Years: 20.00    Pack years: 5.00    Types: Cigarettes  . Smokeless tobacco: Never Used  . Tobacco comment: not every day, working on quitting  Vaping Use  . Vaping Use: Never used  Substance and Sexual Activity  . Alcohol use: Yes    Comment: 1 beer a day  . Drug use: Not Currently    Types: Marijuana    Comment: a few times a year  . Sexual activity: Yes    Birth control/protection: Condom  Other Topics Concern  . Not on file  Social History Narrative   ** Merged History Encounter **       05/14/19 From: [place] Living: with daughter Noah Hove(Karri) and his girlfriend Engineer, structural(Lafara) Work: for [employer]  Family: Noah Hardin is 45 yo  Enjoys: play music - guitar, and DJ skills  Exercise: occasional strength training, has not b   een to the gym since covid Diet: no red meat, chicken and fish, veggies occasionally  Safety Seat belts: Yes  Guns: Yes  and secure Safe in relationships: Yes     Social Determinants of Corporate investment bankerHealth   Financial Resource Strain: Not on file  Food Insecurity: Not on file  Transportation Needs: Not on file  Physical Activity: Not on file  Stress: Not on file  Social Connections: Not on file  Intimate Partner Violence: Not on file    Family History  Problem Relation Age of Onset  . Hypertension Mother   . Hypertension Father   . Aneurysm Paternal Grandfather   . Heart attack Cousin 48    Review of Systems  Constitutional: Negative for chills and fever.  HENT: Negative for congestion and sore throat.   Eyes: Negative for blurred vision and double vision.  Respiratory: Negative for shortness of breath.   Cardiovascular: Positive for chest pain.  Gastrointestinal: Negative for heartburn, nausea and vomiting.  Genitourinary: Negative.   Musculoskeletal: Negative.  Negative for  myalgias.  Skin: Negative for rash.  Neurological: Negative for dizziness and headaches.  Endo/Heme/Allergies: Does not bruise/bleed easily.  Psychiatric/Behavioral: Negative for depression. The patient is not nervous/anxious.      Physical Exam BP 118/62   Pulse 78   Temp 98.5 F (36.9 C) (Temporal)   Ht 6\' 1"  (1.854 m)   Wt 146 lb (66.2 kg)   SpO2 97%   BMI 19.26 kg/m    BP Readings from Last 3 Encounters:  10/06/20 118/62  06/24/20 100/60  08/14/19 114/72      Physical Exam Constitutional:  General: He is not in acute distress.    Appearance: He is well-developed. He is not diaphoretic.  HENT:     Head: Normocephalic and atraumatic.     Right Ear: Tympanic membrane and ear canal normal.     Left Ear: Tympanic membrane and ear canal normal.     Nose: Nose normal.     Mouth/Throat:     Pharynx: Uvula midline.  Eyes:     General: No scleral icterus.    Conjunctiva/sclera: Conjunctivae normal.     Pupils: Pupils are equal, round, and reactive to light.  Cardiovascular:     Rate and Rhythm: Normal rate and regular rhythm.     Heart sounds: Normal heart sounds. No murmur heard.   Pulmonary:     Effort: Pulmonary effort is normal. No respiratory distress.     Breath sounds: Normal breath sounds. No wheezing.  Chest:     Chest wall: No mass, tenderness or crepitus.  Abdominal:     General: Bowel sounds are normal. There is no distension.     Palpations: Abdomen is soft. There is no mass.     Tenderness: There is no abdominal tenderness. There is no guarding.  Musculoskeletal:        General: Normal range of motion.     Cervical back: Normal range of motion and neck supple.  Lymphadenopathy:     Cervical: No cervical adenopathy.  Skin:    General: Skin is warm and dry.     Capillary Refill: Capillary refill takes less than 2 seconds.  Neurological:     Mental Status: He is alert and oriented to person, place, and time.        Results:  PHQ-9:    Depression screen [facility] 2/9 10/06/2020 06/24/2020 05/14/2019  Decreased Interest 0 0 0  Down, Depressed, Hopeless 0 0 0  PHQ - 2 Score 0 0 0      Assessment: 45 y.o. here for routine annual physical examination.  Plan: Problem List Items Addressed This Visit      Other   Tobacco use    Encouraged complete cessation as best thing he could do for his health      Family history of myocardial infarction at age less than 68    Cousin with heart attack at 38. Discussed overall pt with no heart risk factors and first degree relatives with no MI. Discussed smoking cessation and healthy diet and routine screening but do not need additional cardiac work-up      Chronic left shoulder pain    Suspect intermittent chest pain is more MSK shoulder related pain. Minimal symptoms, watch and wait.        Other Visit Diagnoses    Annual physical exam    -  Primary   Encounter for special screening examination for cardiovascular disorder       Relevant Orders   Lipid panel   Colon cancer screening       Relevant Orders   Fecal occult blood, imunochemical   Encounter for screening for hematologic disorder       Relevant Orders   CBC   Need for hepatitis C screening test       Relevant Orders   Hepatitis C antibody   Screening for metabolic disorder       Relevant Orders   Comprehensive metabolic panel      Screening: -- Blood pressure screen normal -- cholesterol screening: will obtain -- Weight screening: normal -- Diabetes Screening:  will obtain -- Nutrition: normal - Encouraged healthy diet  The 10-year ASCVD risk score Denman George DC Jr., et al., 2013) is: 5.3%   Values used to calculate the score:     Age: 29 years     Sex: Male     Is Non-Hispanic African American: Yes     Diabetic: No     Tobacco smoker: Yes     Systolic Blood Pressure: 118 mmHg     Is BP treated: No     HDL Cholesterol: 76.5 mg/dL     Total Cholesterol: 191 mg/dL  -- ASA 81 mg discussed if CVD risk >10%  age 46-59 and willing to take for 10 years -- Statin therapy for Age 51-75 with CVD risk >7.5%  Psych -- Depression screening (PHQ-9): negative  Safety -- tobacco screening: using: discussed quitting using the 5 A's -- alcohol screening:  low-risk usage. -- no evidence of domestic violence or intimate partner violence.  Cancer Screening -- Prostate (age 84-69) not indicated -- Colon (age 24-75) ordered -- Lung not indicated   Immunizations Immunization History  Administered Date(s) Administered  . Influenza,inj,Quad PF,6+ Mos 06/24/2020  . PFIZER(Purple Top)SARS-COV-2 Vaccination 10/04/2019, 10/29/2019  . Tdap 05/14/2019    -- flu vaccine up to date -- TDAP q10 years up to date -- Covid-19 Vaccine - encouraged getting booster in the future  Encouraged regular vision and dental screening. Encouraged healthy exercise and diet.   Noah Hardin

## 2020-10-07 LAB — LIPID PANEL
Cholesterol: 159 mg/dL (ref 0–200)
HDL: 68 mg/dL (ref >39.00)
LDL Cholesterol: 77 mg/dL (ref 0–99)
NonHDL: 91.06
Total CHOL/HDL Ratio: 2
Triglycerides: 72 mg/dL (ref 0.0–149.0)
VLDL: 14.4 mg/dL (ref 0.0–40.0)

## 2020-10-07 LAB — COMPREHENSIVE METABOLIC PANEL
ALT: 10 U/L (ref 0–53)
AST: 18 U/L (ref 0–37)
Albumin: 4 g/dL (ref 3.5–5.2)
Alkaline Phosphatase: 62 U/L (ref 39–117)
BUN: 15 mg/dL (ref 6–23)
CO2: 26 mEq/L (ref 19–32)
Calcium: 9 mg/dL (ref 8.4–10.5)
Chloride: 105 mEq/L (ref 96–112)
Creatinine, Ser: 1.34 mg/dL (ref 0.40–1.50)
GFR: 64.15 mL/min (ref >60.00)
Glucose, Bld: 66 mg/dL — ABNORMAL LOW (ref 70–99)
Potassium: 4.1 mEq/L (ref 3.5–5.1)
Sodium: 138 mEq/L (ref 135–145)
Total Bilirubin: 0.8 mg/dL (ref 0.2–1.2)
Total Protein: 6.7 g/dL (ref 6.0–8.3)

## 2020-10-07 LAB — CBC
HCT: 40 % (ref 39.0–52.0)
Hemoglobin: 13.4 g/dL (ref 13.0–17.0)
MCHC: 33.4 g/dL (ref 30.0–36.0)
MCV: 98.1 fl (ref 78.0–100.0)
Platelets: 267 10*3/uL (ref 150.0–400.0)
RBC: 4.08 Mil/uL — ABNORMAL LOW (ref 4.22–5.81)
RDW: 14.1 % (ref 11.5–15.5)
WBC: 6 10*3/uL (ref 4.0–10.5)

## 2020-10-07 LAB — HEPATITIS C ANTIBODY
Hepatitis C Ab: NONREACTIVE
SIGNAL TO CUT-OFF: 0.01 (ref <1.00)

## 2020-10-13 ENCOUNTER — Other Ambulatory Visit (INDEPENDENT_AMBULATORY_CARE_PROVIDER_SITE_OTHER): Payer: BC Managed Care – PPO

## 2020-10-13 DIAGNOSIS — Z1211 Encounter for screening for malignant neoplasm of colon: Secondary | ICD-10-CM | POA: Diagnosis not present

## 2020-10-13 LAB — FECAL OCCULT BLOOD, IMMUNOCHEMICAL: Fecal Occult Bld: NEGATIVE

## 2021-10-11 ENCOUNTER — Encounter (HOSPITAL_COMMUNITY): Payer: Self-pay

## 2021-10-11 ENCOUNTER — Emergency Department (HOSPITAL_COMMUNITY)
Admission: EM | Admit: 2021-10-11 | Discharge: 2021-10-11 | Disposition: A | Discharge status: Home / Self Care | Payer: BC Managed Care – PPO | Attending: Emergency Medicine | Admitting: Emergency Medicine

## 2021-10-11 ENCOUNTER — Emergency Department (HOSPITAL_COMMUNITY): Payer: BC Managed Care – PPO

## 2021-10-11 DIAGNOSIS — R112 Nausea with vomiting, unspecified: Secondary | ICD-10-CM | POA: Insufficient documentation

## 2021-10-11 DIAGNOSIS — R1084 Generalized abdominal pain: Secondary | ICD-10-CM | POA: Insufficient documentation

## 2021-10-11 DIAGNOSIS — R1030 Lower abdominal pain, unspecified: Secondary | ICD-10-CM | POA: Diagnosis present

## 2021-10-11 LAB — URINALYSIS, ROUTINE W REFLEX MICROSCOPIC
Bilirubin Urine: NEGATIVE
Glucose, UA: NEGATIVE mg/dL
Hgb urine dipstick: NEGATIVE
Ketones, ur: 5 mg/dL — AB
Leukocytes,Ua: NEGATIVE
Nitrite: NEGATIVE
Protein, ur: NEGATIVE mg/dL
Specific Gravity, Urine: 1.02 (ref 1.005–1.030)
pH: 9 — ABNORMAL HIGH (ref 5.0–8.0)

## 2021-10-11 LAB — COMPREHENSIVE METABOLIC PANEL
ALT: 16 U/L (ref 0–44)
AST: 27 U/L (ref 15–41)
Albumin: 4.4 g/dL (ref 3.5–5.0)
Alkaline Phosphatase: 62 U/L (ref 38–126)
Anion gap: 9 (ref 5–15)
BUN: 14 mg/dL (ref 6–20)
CO2: 20 mmol/L — ABNORMAL LOW (ref 22–32)
Calcium: 9.7 mg/dL (ref 8.9–10.3)
Chloride: 109 mmol/L (ref 98–111)
Creatinine, Ser: 1.06 mg/dL (ref 0.61–1.24)
GFR, Estimated: >60 mL/min (ref >60)
Glucose, Bld: 135 mg/dL — ABNORMAL HIGH (ref 70–99)
Potassium: 3.5 mmol/L (ref 3.5–5.1)
Sodium: 138 mmol/L (ref 135–145)
Total Bilirubin: 0.7 mg/dL (ref 0.3–1.2)
Total Protein: 7.3 g/dL (ref 6.5–8.1)

## 2021-10-11 LAB — CBC WITH DIFFERENTIAL/PLATELET
Abs Immature Granulocytes: 0.03 10*3/uL (ref 0.00–0.07)
Basophils Absolute: 0 10*3/uL (ref 0.0–0.1)
Basophils Relative: 0 %
Eosinophils Absolute: 0 10*3/uL (ref 0.0–0.5)
Eosinophils Relative: 0 %
HCT: 39.7 % (ref 39.0–52.0)
Hemoglobin: 14.1 g/dL (ref 13.0–17.0)
Immature Granulocytes: 0 %
Lymphocytes Relative: 13 %
Lymphs Abs: 1 10*3/uL (ref 0.7–4.0)
MCH: 33.4 pg (ref 26.0–34.0)
MCHC: 35.5 g/dL (ref 30.0–36.0)
MCV: 94.1 fL (ref 80.0–100.0)
Monocytes Absolute: 0.6 10*3/uL (ref 0.1–1.0)
Monocytes Relative: 7 %
Neutro Abs: 6.6 10*3/uL (ref 1.7–7.7)
Neutrophils Relative %: 80 %
Platelets: 284 10*3/uL (ref 150–400)
RBC: 4.22 MIL/uL (ref 4.22–5.81)
RDW: 12.5 % (ref 11.5–15.5)
WBC: 8.3 10*3/uL (ref 4.0–10.5)
nRBC: 0 % (ref 0.0–0.2)

## 2021-10-11 LAB — LIPASE, BLOOD: Lipase: 22 U/L (ref 11–51)

## 2021-10-11 MED ORDER — IOHEXOL 300 MG/ML  SOLN
100.0000 mL | Freq: Once | INTRAMUSCULAR | Status: AC | PRN
  Administered 2021-10-11: 100 mL via INTRAVENOUS

## 2021-10-11 MED ORDER — ONDANSETRON HCL 4 MG/2ML IJ SOLN
4.0000 mg | Freq: Once | INTRAMUSCULAR | Status: AC
  Administered 2021-10-11: 4 mg via INTRAVENOUS
  Filled 2021-10-11: qty 2

## 2021-10-11 MED ORDER — MORPHINE SULFATE (PF) 4 MG/ML IV SOLN
4.0000 mg | Freq: Once | INTRAVENOUS | Status: AC
  Administered 2021-10-11: 4 mg via INTRAVENOUS
  Filled 2021-10-11: qty 1

## 2021-10-11 MED ORDER — SODIUM CHLORIDE 0.9 % IV BOLUS
1000.0000 mL | Freq: Once | INTRAVENOUS | Status: AC
  Administered 2021-10-11: 1000 mL via INTRAVENOUS

## 2021-10-11 MED ORDER — HYDROCODONE-ACETAMINOPHEN 5-325 MG PO TABS: 1.0000 | ORAL_TABLET | ORAL | 0 refills | Status: DC | PRN

## 2021-10-11 MED ORDER — ONDANSETRON 4 MG PO TBDP: 4.0000 mg | ORAL_TABLET | Freq: Three times a day (TID) | ORAL | 0 refills | Status: DC | PRN

## 2021-10-11 MED ORDER — SODIUM CHLORIDE (PF) 0.9 % IJ SOLN
INTRAMUSCULAR | Status: AC
  Filled 2021-10-11: qty 50

## 2021-10-11 NOTE — ED Provider Notes (Signed)
?[facility]-EMERGENCY DEPT ?Provider Note ? ? ?CSN: [id number] ?Arrival date & time: 10/11/21  0816 ? ?  ? ?History ? ?Chief Complaint  ?Patient presents with  ? Abdominal Pain  ? ? ?Noah Hardin is a 46 y.o. male. ? ?Pt is a 46 yo male with no significant pmhx.  He woke up with severe pain in his lower abdomen.  He also had some n/v.  He did not have back pain.  No hematuria.  He has never had anything like this in the past.  ? ? ?  ? ?Home Medications ?Prior to Admission medications   ?Medication Sig Start Date End Date Taking? Authorizing Provider  ?HYDROcodone-acetaminophen (NORCO/VICODIN) 5-325 MG tablet Take 1 tablet by mouth every 4 (four) hours as needed. 10/11/21  Yes Jacalyn Lefevre, MD  ?ibuprofen (ADVIL) 200 MG tablet Take 400-600 mg by mouth every 6 (six) hours as needed for mild pain.   Yes [provider]  ?ondansetron (ZOFRAN-ODT) 4 MG disintegrating tablet Take 1 tablet (4 mg total) by mouth every 8 (eight) hours as needed for nausea or vomiting. 10/11/21  Yes Jacalyn Lefevre, MD  ?   ? ?Allergies    ?Patient has no known allergies.   ? ?Review of Systems   ?Review of Systems  ?Gastrointestinal:  Positive for abdominal pain, nausea and vomiting.  ?All other systems reviewed and are negative. ? ?Physical Exam ?Updated Vital Signs ?BP (!) 161/96 Comment: Groaning, in pain.  Pulse 82   Temp 98.3 ?F (36.8 ?C) (Oral)   Resp 18   SpO2 100%  ?Physical Exam ?Vitals and nursing note reviewed.  ?Constitutional:   ?   Appearance: He is well-developed.  ?HENT:  ?   Head: Normocephalic and atraumatic.  ?   Mouth/Throat:  ?   Mouth: Mucous membranes are dry.  ?Eyes:  ?   Extraocular Movements: Extraocular movements intact.  ?   Pupils: Pupils are equal, round, and reactive to light.  ?Cardiovascular:  ?   Rate and Rhythm: Normal rate and regular rhythm.  ?Pulmonary:  ?   Effort: Pulmonary effort is normal.  ?   Breath sounds: Normal breath sounds.  ?Abdominal:  ?   General:  Abdomen is flat. Bowel sounds are normal.  ?   Palpations: Abdomen is soft.  ?   Tenderness: There is generalized abdominal tenderness.  ?Skin: ?   General: Skin is warm.  ?   Capillary Refill: Capillary refill takes less than 2 seconds.  ?Neurological:  ?   General: No focal deficit present.  ?   Mental Status: He is alert and oriented to person, place, and time.  ?Psychiatric:     ?   Mood and Affect: Mood normal.     ?   Behavior: Behavior normal.  ? ? ?ED Results / Procedures / Treatments   ?Labs ?(all labs ordered are listed, but only abnormal results are displayed) ?Labs Reviewed  ?COMPREHENSIVE METABOLIC PANEL - Abnormal; Notable for the following components:  ?    Result Value  ? CO2 20 (*)   ? Glucose, Bld 135 (*)   ? All other components within normal limits  ?URINALYSIS, ROUTINE W REFLEX MICROSCOPIC - Abnormal; Notable for the following components:  ? Color, Urine STRAW (*)   ? pH 9.0 (*)   ? Ketones, ur 5 (*)   ? All other components within normal limits  ?CBC WITH DIFFERENTIAL/PLATELET  ?LIPASE, BLOOD  ? ? ?EKG ?None ? ?Radiology ?CT Abdomen  Pelvis W Contrast ? ?Result Date: 10/11/2021 ?CLINICAL DATA:  Acute abdominal pain and vomiting beginning this morning. EXAM: CT ABDOMEN AND PELVIS WITH CONTRAST TECHNIQUE: Multidetector CT imaging of the abdomen and pelvis was performed using the standard protocol following bolus administration of intravenous contrast. RADIATION DOSE REDUCTION: This exam was performed according to the departmental dose-optimization program which includes automated exposure control, adjustment of the mA and/or kV according to patient size and/or use of iterative reconstruction technique. CONTRAST:  100mL OMNIPAQUE IOHEXOL 300 MG/ML  SOLN COMPARISON:  None. FINDINGS: Lower Chest: No acute findings. Hepatobiliary: No hepatic masses identified. Gallbladder is unremarkable. No evidence of biliary ductal dilatation. Pancreas:  No mass or inflammatory changes. Spleen: Within normal limits  in size and appearance. Adrenals/Urinary Tract: No masses identified. No evidence of ureteral calculi or hydronephrosis. Stomach/Bowel: No evidence of obstruction, inflammatory process or abnormal fluid collections. Normal appendix visualized. Vascular/Lymphatic: No pathologically enlarged lymph nodes. No acute vascular findings. Reproductive:  No mass or other significant abnormality. Other:  None. Musculoskeletal:  No suspicious bone lesions identified. IMPRESSION: Negative. No acute findings or other significant abnormality. Electronically Signed   By: Danae OrleansJohn A Stahl M.D.   On: 10/11/2021 10:59   ? ?Procedures ?Procedures  ? ? ?Medications Ordered in ED ?Medications  ?sodium chloride (PF) 0.9 % injection (has no administration in time range)  ?morphine (PF) 4 MG/ML injection 4 mg (4 mg Intravenous Given 10/11/21 0914)  ?ondansetron [facility](ZOFRAN) injection 4 mg (4 mg Intravenous Given 10/11/21 0914)  ?sodium chloride 0.9 % bolus 1,000 mL (0 mLs Intravenous Stopped 10/11/21 0951)  ?iohexol (OMNIPAQUE) 300 MG/ML solution 100 mL (100 mLs Intravenous Contrast Given 10/11/21 1027)  ? ? ?ED Course/ Medical Decision Making/ A&P ?  ?                        ?Medical Decision Making ?Amount and/or Complexity of Data Reviewed ?Labs: ordered. ?Radiology: ordered. ? ?Risk ?Prescription drug management. ? ? ?This patient presents to the ED for concern of abd pain, this involves an extensive number of treatment options, and is a complaint that carries with it a high risk of complications and morbidity.  The differential diagnosis includes acute appendicitis, cholecystitis, uti, kidney stone, constipation ? ? ?Co morbidities that complicate the patient evaluation ? ?none ? ? ?Additional history obtained: ? ?Additional history obtained from epic chart review ?External records from outside source obtained and reviewed including father ? ? ?Lab Tests: ? ?I Ordered, and personally interpreted labs.  The pertinent results include:  cbc nl, cmp  nl, ua nl, lip nl ? ? ?Imaging Studies ordered: ? ?I ordered imaging studies including ct abd/pelvis  ?I independently visualized and interpreted imaging which showed  ?  ?IMPRESSION:  ?Negative. No acute findings or other significant abnormality.  ? ?I agree with the radiologist interpretation ? ? ?Cardiac Monitoring: ? ?The patient was maintained on a cardiac monitor.  I personally viewed and interpreted the cardiac monitored which showed an underlying rhythm of: nsr ? ? ?Medicines ordered and prescription drug management: ? ?I ordered medication including morphine and zofran  for pain and nausea  ?Reevaluation of the patient after these medicines showed that the patient improved ?I have reviewed the patients home medicines and have made adjustments as needed ? ? ?Test Considered: ? ?ct ? ? ?Critical Interventions: ? ?Pain meds ? ? ? ?Problem List / ED Course: ? ?Abd pain:  no definite cause.  ? Passed kidney stone (due  to clinical presentation), but no stone on CT or kidney/ureter dilation and ua nl.  Pt is feeling much better now.  He is stable for d/c.  Return if worse.  F/u with pcp. ? ? ?Reevaluation: ? ?After the interventions noted above, I reevaluated the patient and found that they have :improved ? ? ?Social Determinants of Health: ? ?Lives at home ? ? ?Dispostion: ? ?After consideration of the diagnostic results and the patients response to treatment, I feel that the patent would benefit from discharge with outpatient f/u.   ? ? ? ? ? ? ? ?Final Clinical Impression(s) / ED Diagnoses ?Final diagnoses:  ?Generalized abdominal pain  ? ? ?Rx / DC Orders ?ED Discharge Orders   ? ?      Ordered  ?  ondansetron (ZOFRAN-ODT) 4 MG disintegrating tablet  Every 8 hours PRN       ? 10/11/21 1112  ?  HYDROcodone-acetaminophen (NORCO/VICODIN) 5-325 MG tablet  Every 4 hours PRN       ? 10/11/21 1112  ? ?  ?  ? ?  ? ? ?  ?Jacalyn Lefevre, MD ?10/11/21 1114 ? ?

## 2021-10-11 NOTE — ED Notes (Signed)
Pt attempted to urinate but was unable to. Pt was made aware that a urine sample is needed. ?

## 2021-10-11 NOTE — ED Triage Notes (Signed)
Pt presents with c/o abdominal pain that started this morning. Pt also reports some vomiting as well. ?

## 2021-11-12 ENCOUNTER — Ambulatory Visit (INDEPENDENT_AMBULATORY_CARE_PROVIDER_SITE_OTHER): Payer: BC Managed Care – PPO | Admitting: Family Medicine

## 2021-11-12 VITALS — BP 100/60 | HR 76 | Wt 148.4 lb

## 2021-11-12 DIAGNOSIS — M545 Low back pain, unspecified: Secondary | ICD-10-CM

## 2021-11-12 MED ORDER — LIDOCAINE 5 % EX PTCH: 1.0000 | MEDICATED_PATCH | CUTANEOUS | 0 refills | Status: DC

## 2021-11-12 NOTE — Assessment & Plan Note (Signed)
Recurrent issue for patient. Cont ibuprofen. Lidocaine patch per request. Declined flexeril.  As symptoms are improving discussed home exercises and ibuprofen.  He has a high labor job, discussed that if symptoms are not improving over the next 2 weeks would recommend course of physical therapy.

## 2021-11-12 NOTE — Progress Notes (Signed)
Subjective:     Noah Hardin is a 45 y.o. male presenting for Back Pain     Back Pain This is a new problem. The current episode started in the past 7 days. The problem occurs intermittently. The problem has been gradually improving since onset. The pain is present in the lumbar spine. Radiates to: anterior left hip. The pain is mild. The pain is The same all the time. Exacerbated by: lifting a heavy box. Associated symptoms include tingling (anterior leg). Pertinent negatives include no bladder incontinence, bowel incontinence, leg pain, numbness or weakness. He has tried NSAIDs, ice and home exercises for the symptoms. The treatment provided moderate relief.    Had some abdominal pain about 1 month ago - shooting pain in the abdomen which has resolved  Review of Systems  Gastrointestinal:  Negative for bowel incontinence.  Genitourinary:  Negative for bladder incontinence.  Musculoskeletal:  Positive for back pain.  Neurological:  Positive for tingling (anterior leg). Negative for weakness and numbness.    Social History   Tobacco Use  Smoking Status Some Days   Packs/day: 0.25   Years: 20.00   Pack years: 5.00   Types: Cigarettes  Smokeless Tobacco Never  Tobacco Comments   not every day, working on quitting        Objective:    BP Readings from Last 3 Encounters:  11/12/21 100/60  10/11/21 (!) 156/93  10/06/20 118/62   Wt Readings from Last 3 Encounters:  11/12/21 148 lb 6 oz (67.3 kg)  10/06/20 146 lb (66.2 kg)  06/24/20 144 lb (65.3 kg)    BP 100/60   Pulse 76   Wt 148 lb 6 oz (67.3 kg)   SpO2 99%   BMI 19.58 kg/m    Physical Exam Constitutional:      Appearance: Normal appearance. He is not ill-appearing or diaphoretic.  HENT:     Right Ear: External ear normal.     Left Ear: External ear normal.     Nose: Nose normal.  Eyes:     General: No scleral icterus.    Extraocular Movements: Extraocular movements intact.      Conjunctiva/sclera: Conjunctivae normal.  Cardiovascular:     Rate and Rhythm: Normal rate.  Pulmonary:     Effort: Pulmonary effort is normal.  Musculoskeletal:     Cervical back: Neck supple.     Comments: Back Inspection: no abnormalities Palpation: No spinous process ttp, right lumbar Paraspinous ttp ROM: normal flexion, extension, rotation, and lateral flexion, pain with extension and flexion Strength: Normal bilateral hip flexion/extension, knee flexion/extension, dorsiflexion/extension Straight leg raise - negative b/l    Skin:    General: Skin is warm and dry.  Neurological:     Mental Status: He is alert. Mental status is at baseline.  Psychiatric:        Mood and Affect: Mood normal.          Assessment & Plan:   Problem List Items Addressed This Visit       Other   Acute right-sided low back pain without sciatica - Primary    Recurrent issue for patient. Cont ibuprofen. Lidocaine patch per request. Declined flexeril.  As symptoms are improving discussed home exercises and ibuprofen.  He has a high labor job, discussed that if symptoms are not improving over the next 2 weeks would recommend course of physical therapy.       Relevant Medications   lidocaine (LIDODERM) 5 %  Return if symptoms worsen or fail to improve.  Lynnda Child, MD

## 2021-11-12 NOTE — Patient Instructions (Addendum)
Lidocaine patch - if $$$ can try your expired ones at home or they have over the counter Lidocaine - SalonPas -- 4%  Continue ibuprofen  Continue stretches and walk around   Take a break from work  Call if you want to try Flexeril    If still severe in 2-3 weeks -- may want to consider physical therapy

## 2022-01-12 ENCOUNTER — Ambulatory Visit (INDEPENDENT_AMBULATORY_CARE_PROVIDER_SITE_OTHER): Payer: BC Managed Care – PPO | Admitting: Family Medicine

## 2022-01-12 VITALS — BP 100/60 | HR 80 | Temp 97.6°F | Ht 73.5 in | Wt 150.0 lb

## 2022-01-12 DIAGNOSIS — Z1211 Encounter for screening for malignant neoplasm of colon: Secondary | ICD-10-CM

## 2022-01-12 DIAGNOSIS — Z0000 Encounter for general adult medical examination without abnormal findings: Secondary | ICD-10-CM | POA: Diagnosis not present

## 2022-01-12 NOTE — Patient Instructions (Signed)
Continue healthy diet and regular exercise.  

## 2022-01-12 NOTE — Progress Notes (Signed)
Annual Exam   Chief Complaint:  Chief Complaint  Patient presents with   Annual Exam    No concerns    History of Present Illness:  Noah Hardin is a 46 y.o. presents today for annual examination.     Nutrition/Lifestyle Diet: generally healthy - no red meat Exercise: tries to do weights, every few days He is single partner, contraception - condoms most of the time.  Any issues with getting or keeping erection? No  Social History   Tobacco Use  Smoking Status Some Days   Packs/day: 0.25   Years: 20.00   Total pack years: 5.00   Types: Cigarettes  Smokeless Tobacco Never  Tobacco Comments   not every day, working on quitting   Social History   Substance and Sexual Activity  Alcohol Use Yes   Comment: 1 beer a day   Social History   Substance and Sexual Activity  Drug Use Not Currently   Types: Marijuana   Comment: a few times a year     Safety The patient wears seatbelts: yes.     The patient feels safe at home and in their relationships: yes.  General Health Dentist in the last year: No Eye doctor: needs to go back  Weight Wt Readings from Last 3 Encounters:  01/12/22 150 lb (68 kg)  11/12/21 148 lb 6 oz (67.3 kg)  10/06/20 146 lb (66.2 kg)   Patient has normal BMI  BMI Readings from Last 1 Encounters:  01/12/22 19.52 kg/m     Chronic disease screening Blood pressure monitoring:  BP Readings from Last 3 Encounters:  01/12/22 100/60  11/12/21 100/60  10/11/21 (!) 156/93    Lipid Monitoring: Indication for screening: age >35, obesity, diabetes, family hx, CV risk factors.  Lipid screening: Yes  Lab Results  Component Value Date   CHOL 159 10/06/2020   HDL 68.00 10/06/2020   LDLCALC 77 10/06/2020   TRIG 72.0 10/06/2020   CHOLHDL 2 10/06/2020     Diabetes Screening: age >49, overweight, family hx, PCOS, hx of gestational diabetes, at risk ethnicity, elevated blood pressure >135/80.  Diabetes Screening screening: Not  Indicated  No results found for: "HGBA1C"      Colon Cancer Screening:  Age 27-75 yo - benefits outweigh the risk. Adults 25-85 yo who have never been screened benefit.  Benefits: 134000 people in 2016 will be diagnosed and 49,000 will die - early detection helps Harms: Complications 2/2 to colonoscopy High Risk (Colonoscopy): genetic disorder (Lynch syndrome or familial adenomatous polyposis), personal hx of IBD, previous adenomatous polyp, or previous colorectal cancer, FamHx start 10 years before the age at diagnosis, increased in males and black race  Options:  FIT - looks for hemoglobin (blood in the stool) - specific and fairly sensitive - must be done annually Cologuard - looks for DNA and blood - more sensitive - therefore can have more false positives, every 3 years Colonoscopy - every 10 years if normal - sedation, bowl prep, must have someone drive you  Shared decision making and the patient had decided to do fobt.   Social History   Tobacco Use  Smoking Status Some Days   Packs/day: 0.25   Years: 20.00   Total pack years: 5.00   Types: Cigarettes  Smokeless Tobacco Never  Tobacco Comments   not every day, working on quitting    Lung Cancer Screening (Ages 123456): not applicable 20 year pack history? No    No past medical history on  file.  Past Surgical History:  Procedure Laterality Date   NO PAST SURGERIES      Prior to Admission medications   Not on File    No Known Allergies   Social History   Socioeconomic History   Marital status: Single    Spouse name: Not on file   Number of children: 1   Years of education: High school   Highest education level: Not on file  Occupational History   Not on file  Tobacco Use   Smoking status: Some Days    Packs/day: 0.25    Years: 20.00    Total pack years: 5.00    Types: Cigarettes   Smokeless tobacco: Never   Tobacco comments:    not every day, working on quitting  Vaping Use   Vaping Use:  Never used  Substance and Sexual Activity   Alcohol use: Yes    Comment: 1 beer a day   Drug use: Not Currently    Types: Marijuana    Comment: a few times a year   Sexual activity: Yes    Birth control/protection: Condom  Other Topics Concern   Not on file  Social History Narrative   ** Merged History Encounter **       05/14/19 From: born in [place] and then [place] and then [place] Living: with daughter Arlana Hove) and his girlfriend Engineer, structural) Work: for [employer]  Family: Arlana Hove is 46 yo  Enjoys: play music - guitar, and DJ skills  Exercise: occasional strength training, has not b   een to the gym since covid Diet: no red meat, chicken and fish, veggies occasionally  Safety Seat belts: Yes  Guns: Yes  and secure Safe in relationships: Yes     Social Determinants of Health   Financial Resource Strain: Low Risk  (05/14/2019)   Overall Financial Resource Strain (CARDIA)    Difficulty of Paying Living Expenses: Not hard at all  Food Insecurity: Not on file  Transportation Needs: Not on file  Physical Activity: Not on file  Stress: Not on file  Social Connections: Not on file  Intimate Partner Violence: Not on file    Family History  Problem Relation Age of Onset   Hypertension Mother    Hypertension Father    Aneurysm Paternal Grandfather    Heart attack Cousin 48    Review of Systems  Constitutional:  Negative for chills and fever.  HENT:  Negative for congestion and sore throat.   Eyes:  Negative for blurred vision and double vision.  Respiratory:  Negative for shortness of breath.   Cardiovascular:  Negative for chest pain.  Gastrointestinal:  Negative for heartburn, nausea and vomiting.  Genitourinary: Negative.   Musculoskeletal: Negative.  Negative for myalgias.  Skin:  Negative for rash.  Neurological:  Negative for dizziness and headaches.  Endo/Heme/Allergies:  Does not bruise/bleed easily.  Psychiatric/Behavioral:  Negative for depression. The patient is not  nervous/anxious.      Physical Exam BP 100/60   Pulse 80   Temp 97.6 F (36.4 C) (Temporal)   Ht 6' 1.5" (1.867 m)   Wt 150 lb (68 kg)   SpO2 98%   BMI 19.52 kg/m    BP Readings from Last 3 Encounters:  01/12/22 100/60  11/12/21 100/60  10/11/21 (!) 156/93      Physical Exam Constitutional:      General: He is not in acute distress.    Appearance: He is well-developed. He is not diaphoretic.  HENT:  Head: Normocephalic and atraumatic.     Right Ear: Tympanic membrane and ear canal normal.     Left Ear: Tympanic membrane and ear canal normal.     Nose: Nose normal.     Mouth/Throat:     Pharynx: Uvula midline.  Eyes:     General: No scleral icterus.    Conjunctiva/sclera: Conjunctivae normal.     Pupils: Pupils are equal, round, and reactive to light.  Cardiovascular:     Rate and Rhythm: Normal rate and regular rhythm.     Heart sounds: Normal heart sounds. No murmur heard. Pulmonary:     Effort: Pulmonary effort is normal. No respiratory distress.     Breath sounds: Normal breath sounds. No wheezing.  Abdominal:     General: Bowel sounds are normal. There is no distension.     Palpations: Abdomen is soft. There is no mass.     Tenderness: There is no abdominal tenderness. There is no guarding.  Musculoskeletal:        General: Normal range of motion.     Cervical back: Normal range of motion and neck supple.  Lymphadenopathy:     Cervical: No cervical adenopathy.  Skin:    General: Skin is warm and dry.     Capillary Refill: Capillary refill takes less than 2 seconds.  Neurological:     Mental Status: He is alert and oriented to person, place, and time.        Results:  PHQ-9:     10/06/2020    4:02 PM 06/24/2020   11:25 AM 05/14/2019    2:43 PM  Depression screen PHQ 2/9  Decreased Interest 0 0 0  Down, Depressed, Hopeless 0 0 0  PHQ - 2 Score 0 0 0       Assessment: 46 y.o. here for routine annual physical  examination.  Plan: Problem List Items Addressed This Visit   None Visit Diagnoses     Annual physical exam    -  Primary   Colon cancer screening       Relevant Orders   Fecal occult blood, imunochemical       Screening: -- Blood pressure screen normal -- cholesterol screening: not due for screening -- Weight screening: normal -- Diabetes Screening: not due for screening -- Nutrition: normal - Encouraged healthy diet  The 10-year ASCVD risk score (Arnett DK, et al., 2019) is: 4.1%   Values used to calculate the score:     Age: 25 years     Sex: Male     Is Non-Hispanic African American: Yes     Diabetic: No     Tobacco smoker: Yes     Systolic Blood Pressure: 123XX123 mmHg     Is BP treated: No     HDL Cholesterol: 68 mg/dL     Total Cholesterol: 159 mg/dL  -- ASA 81 mg discussed if CVD risk >10% age 36-59 and willing to take for 10 years -- Statin therapy for Age 15-75 with CVD risk >7.5%  Psych -- Depression screening (PHQ-9): negative  Safety -- tobacco screening:  very minimal use, encouraged continued efforts to stop -- alcohol screening:  low-risk usage. -- no evidence of domestic violence or intimate partner violence.  Cancer Screening -- Prostate (age 44-69) not indicated -- Colon (age 76-75) ordered -- Lung not indicated   Immunizations Immunization History  Administered Date(s) Administered   Influenza,inj,Quad PF,6+ Mos 06/24/2020   PFIZER(Purple Top)SARS-COV-2 Vaccination 10/04/2019, 10/29/2019   Tdap 05/14/2019    --  flu vaccine not in season -- TDAP q10 years up to date -- Covid-19 Vaccine up to date  Encouraged regular vision and dental screening. Encouraged healthy exercise and diet.   Lynnda Child

## 2022-10-13 NOTE — Progress Notes (Unsigned)
    Kori Colin T. Cale Decarolis, MD, CAQ Sports Medicine [facility] [address] [place], [address]  Phone: [phone]  FAX: [phone]  Danis Pembleton - 47 y.o. male  MRN [id number]  Date of Birth: 1975-12-13  Date: 10/14/2022  PCP: Gweneth Dimitri, MD  Referral: Gweneth Dimitri, MD  No chief complaint on file.  Subjective:   Pawel Soules is a 47 y.o. very pleasant male patient with There is no height or weight on file to calculate BMI. who presents with the following:  Patient presents with some ongoing back pain.    Review of Systems is noted in the HPI, as appropriate  Objective:   There were no vitals taken for this visit.  GEN: No acute distress; alert,appropriate. PULM: Breathing comfortably in no respiratory distress PSYCH: Normally interactive.   Laboratory and Imaging Data:  Assessment and Plan:   ***

## 2022-10-14 ENCOUNTER — Ambulatory Visit: Payer: BC Managed Care – PPO | Admitting: Family Medicine

## 2022-10-14 ENCOUNTER — Encounter: Payer: Self-pay | Admitting: Family Medicine

## 2022-10-14 VITALS — BP 106/70 | HR 85 | Temp 98.2°F | Ht 73.5 in | Wt 146.2 lb

## 2022-10-14 DIAGNOSIS — M545 Low back pain, unspecified: Secondary | ICD-10-CM | POA: Diagnosis not present

## 2022-10-14 MED ORDER — DICLOFENAC SODIUM 75 MG PO TBEC: 75.0000 mg | DELAYED_RELEASE_TABLET | Freq: Two times a day (BID) | ORAL | 2 refills | Status: DC

## 2022-10-28 ENCOUNTER — Encounter: Payer: Self-pay | Admitting: Nurse Practitioner

## 2022-10-28 ENCOUNTER — Ambulatory Visit (INDEPENDENT_AMBULATORY_CARE_PROVIDER_SITE_OTHER): Payer: BC Managed Care – PPO | Admitting: Nurse Practitioner

## 2022-10-28 VITALS — BP 98/60 | HR 85 | Temp 98.4°F | Resp 16 | Ht 73.5 in | Wt 146.2 lb

## 2022-10-28 DIAGNOSIS — H6121 Impacted cerumen, right ear: Secondary | ICD-10-CM | POA: Diagnosis not present

## 2022-10-28 DIAGNOSIS — M545 Low back pain, unspecified: Secondary | ICD-10-CM | POA: Diagnosis not present

## 2022-10-28 DIAGNOSIS — Z7689 Persons encountering health services in other specified circumstances: Secondary | ICD-10-CM | POA: Insufficient documentation

## 2022-10-28 DIAGNOSIS — Z0000 Encounter for general adult medical examination without abnormal findings: Secondary | ICD-10-CM | POA: Insufficient documentation

## 2022-10-28 NOTE — Progress Notes (Signed)
Established Patient Office Visit  Subjective   Patient ID: Noah Hardin, male    DOB: Dec 01, 1975  Age: 47 y.o. MRN: [id number]  Chief Complaint  Patient presents with   Establish Care    Transferring from Dr. Selena Batten    HPI  Transfer of care: last seen by Gweneth Dimitri, MD on 01/12/2022. Last CPE 01/12/2022   Acute right sided back pain: has been seen by Shanda Bumps cody and spencer copland . States that he did the exercises that helped. States that he did not take the dicclofenac. Tooks some advil that helped   Tobacco use: states that he has quit in the past and did cold [place]. States that he does it infrquenctly. Does not   Colonoscopy: Has done iFOB in the past order was placed but patient has not completed.  Did discuss colonoscopy also will discuss again at patient's physical   Review of Systems  Constitutional:  Negative for chills and fever.  Respiratory:  Negative for shortness of breath.   Cardiovascular:  Negative for chest pain and leg swelling.  Gastrointestinal:  Negative for abdominal pain, blood in stool, constipation, diarrhea, nausea and vomiting.       BM daily  Genitourinary:  Negative for dysuria and hematuria.  Neurological:  Negative for tingling and headaches.  Psychiatric/Behavioral:  Negative for hallucinations and suicidal ideas.       Objective:     BP 98/60   Pulse 85   Temp 98.4 F (36.9 C)   Resp 16   Ht 6' 1.5" (1.867 m)   Wt 146 lb 4 oz (66.3 kg)   SpO2 99%   BMI 19.03 kg/m    Physical Exam HENT:     Right Ear: Ear canal and external ear normal. There is impacted cerumen.     Left Ear: Tympanic membrane, ear canal and external ear normal.     Mouth/Throat:     Mouth: Mucous membranes are moist.     Pharynx: Oropharynx is clear.  Eyes:     Extraocular Movements: Extraocular movements intact.     Pupils: Pupils are equal, round, and reactive to light.  Cardiovascular:     Rate and Rhythm: Normal rate and regular rhythm.      Heart sounds: Normal heart sounds.  Pulmonary:     Effort: Pulmonary effort is normal.     Breath sounds: Normal breath sounds.  Musculoskeletal:     Right lower leg: No edema.     Left lower leg: No edema.  Lymphadenopathy:     Cervical: No cervical adenopathy.      No results found for any visits on 10/28/22.    The 10-year ASCVD risk score (Arnett DK, et al., 2019) is: 4.2%    Assessment & Plan:   Problem List Items Addressed This Visit       Nervous and Auditory   Impacted cerumen of right ear    Verbal consent obtained.  Patient was prepped per office policy with cerumen softening eardrops.  A mixture of water and hydroperoxide was used and the ear was irrigated.  Patient tolerated procedure well.  Disimpaction was removed TM within normal limits post procedure.      Relevant Orders   Ear Lavage     Other   Acute right-sided low back pain without sciatica    Was seen by primary care provider and sports medicine physician.  Stretches and exercises helped greatly.  Patient was written a prescription of diclofenac but he  never did take the medication.      Establishing care with new doctor, encounter for - Primary    Transition of care from previous provider in office that no longer practices here.  Brief review of EMR done       Return in about 3 months (around 01/28/2023) for CPE and Labs.    Audria Nine, NP

## 2022-10-28 NOTE — Assessment & Plan Note (Signed)
Verbal consent obtained.  Patient was prepped per office policy with cerumen softening eardrops.  A mixture of water and hydroperoxide was used and the ear was irrigated.  Patient tolerated procedure well.  Disimpaction was removed TM within normal limits post procedure.

## 2022-10-28 NOTE — Patient Instructions (Signed)
Nice to see you today I want to see you in 3 months for your physical and labs, sooner if you need me

## 2022-10-28 NOTE — Assessment & Plan Note (Signed)
Was seen by primary care provider and sports medicine physician.  Stretches and exercises helped greatly.  Patient was written a prescription of diclofenac but he never did take the medication.

## 2022-10-28 NOTE — Assessment & Plan Note (Signed)
Transition of care from previous provider in office that no longer practices here.  Brief review of EMR done

## 2023-01-12 ENCOUNTER — Emergency Department (HOSPITAL_BASED_OUTPATIENT_CLINIC_OR_DEPARTMENT_OTHER)
Admission: EM | Admit: 2023-01-12 | Discharge: 2023-01-12 | Disposition: A | Discharge status: Home / Self Care | Payer: BC Managed Care – PPO | Attending: Emergency Medicine | Admitting: Emergency Medicine

## 2023-01-12 ENCOUNTER — Emergency Department (HOSPITAL_BASED_OUTPATIENT_CLINIC_OR_DEPARTMENT_OTHER): Payer: BC Managed Care – PPO

## 2023-01-12 ENCOUNTER — Encounter (HOSPITAL_BASED_OUTPATIENT_CLINIC_OR_DEPARTMENT_OTHER): Payer: Self-pay | Admitting: Emergency Medicine

## 2023-01-12 ENCOUNTER — Other Ambulatory Visit: Payer: Self-pay

## 2023-01-12 DIAGNOSIS — F12188 Cannabis abuse with other cannabis-induced disorder: Secondary | ICD-10-CM | POA: Insufficient documentation

## 2023-01-12 DIAGNOSIS — R1084 Generalized abdominal pain: Secondary | ICD-10-CM | POA: Insufficient documentation

## 2023-01-12 DIAGNOSIS — R112 Nausea with vomiting, unspecified: Secondary | ICD-10-CM | POA: Diagnosis present

## 2023-01-12 LAB — COMPREHENSIVE METABOLIC PANEL
ALT: 18 U/L (ref 0–44)
AST: 28 U/L (ref 15–41)
Albumin: 4.6 g/dL (ref 3.5–5.0)
Alkaline Phosphatase: 65 U/L (ref 38–126)
Anion gap: 14 (ref 5–15)
BUN: 17 mg/dL (ref 6–20)
CO2: 20 mmol/L — ABNORMAL LOW (ref 22–32)
Calcium: 9.9 mg/dL (ref 8.9–10.3)
Chloride: 104 mmol/L (ref 98–111)
Creatinine, Ser: 1.16 mg/dL (ref 0.61–1.24)
GFR, Estimated: >60 mL/min (ref >60)
Glucose, Bld: 150 mg/dL — ABNORMAL HIGH (ref 70–99)
Potassium: 3.9 mmol/L (ref 3.5–5.1)
Sodium: 138 mmol/L (ref 135–145)
Total Bilirubin: 1.2 mg/dL (ref 0.3–1.2)
Total Protein: 7.9 g/dL (ref 6.5–8.1)

## 2023-01-12 LAB — CBC WITH DIFFERENTIAL/PLATELET
Abs Immature Granulocytes: 0.03 10*3/uL (ref 0.00–0.07)
Basophils Absolute: 0 10*3/uL (ref 0.0–0.1)
Basophils Relative: 0 %
Eosinophils Absolute: 0 10*3/uL (ref 0.0–0.5)
Eosinophils Relative: 0 %
HCT: 38.8 % — ABNORMAL LOW (ref 39.0–52.0)
Hemoglobin: 13.4 g/dL (ref 13.0–17.0)
Immature Granulocytes: 0 %
Lymphocytes Relative: 19 %
Lymphs Abs: 1.3 10*3/uL (ref 0.7–4.0)
MCH: 33.3 pg (ref 26.0–34.0)
MCHC: 34.5 g/dL (ref 30.0–36.0)
MCV: 96.5 fL (ref 80.0–100.0)
Monocytes Absolute: 0.7 10*3/uL (ref 0.1–1.0)
Monocytes Relative: 11 %
Neutro Abs: 4.9 10*3/uL (ref 1.7–7.7)
Neutrophils Relative %: 70 %
Platelets: 228 10*3/uL (ref 150–400)
RBC: 4.02 MIL/uL — ABNORMAL LOW (ref 4.22–5.81)
RDW: 12.6 % (ref 11.5–15.5)
WBC: 7.1 10*3/uL (ref 4.0–10.5)
nRBC: 0 % (ref 0.0–0.2)

## 2023-01-12 LAB — URINALYSIS, ROUTINE W REFLEX MICROSCOPIC
Bilirubin Urine: NEGATIVE
Glucose, UA: NEGATIVE mg/dL
Hgb urine dipstick: NEGATIVE
Ketones, ur: 15 mg/dL — AB
Leukocytes,Ua: NEGATIVE
Nitrite: NEGATIVE
Protein, ur: 30 mg/dL — AB
Specific Gravity, Urine: 1.02 (ref 1.005–1.030)
pH: 8.5 — ABNORMAL HIGH (ref 5.0–8.0)

## 2023-01-12 LAB — ETHANOL: Alcohol, Ethyl (B): <10 mg/dL (ref <10)

## 2023-01-12 LAB — RAPID URINE DRUG SCREEN, HOSP PERFORMED
Amphetamines: NOT DETECTED
Barbiturates: NOT DETECTED
Benzodiazepines: NOT DETECTED
Cocaine: NOT DETECTED
Opiates: POSITIVE — AB
Tetrahydrocannabinol: POSITIVE — AB

## 2023-01-12 LAB — URINALYSIS, MICROSCOPIC (REFLEX)

## 2023-01-12 LAB — LIPASE, BLOOD: Lipase: 26 U/L (ref 11–51)

## 2023-01-12 MED ORDER — ONDANSETRON HCL 4 MG PO TABS: 4.0000 mg | ORAL_TABLET | Freq: Four times a day (QID) | ORAL | 0 refills | Status: DC | PRN

## 2023-01-12 MED ORDER — SODIUM CHLORIDE 0.9 % IV BOLUS
1000.0000 mL | Freq: Once | INTRAVENOUS | Status: AC
  Administered 2023-01-12: 1000 mL via INTRAVENOUS

## 2023-01-12 MED ORDER — DICYCLOMINE HCL 20 MG PO TABS: 20.0000 mg | ORAL_TABLET | Freq: Two times a day (BID) | ORAL | 0 refills | Status: DC

## 2023-01-12 MED ORDER — DIPHENHYDRAMINE HCL 50 MG/ML IJ SOLN
12.5000 mg | Freq: Once | INTRAMUSCULAR | Status: AC
  Administered 2023-01-12: 12.5 mg via INTRAVENOUS
  Filled 2023-01-12: qty 1

## 2023-01-12 MED ORDER — MORPHINE SULFATE (PF) 4 MG/ML IV SOLN
4.0000 mg | Freq: Once | INTRAVENOUS | Status: AC
  Administered 2023-01-12: 4 mg via INTRAVENOUS
  Filled 2023-01-12: qty 1

## 2023-01-12 MED ORDER — ONDANSETRON HCL 4 MG/2ML IJ SOLN
4.0000 mg | Freq: Once | INTRAMUSCULAR | Status: AC
  Administered 2023-01-12: 4 mg via INTRAVENOUS
  Filled 2023-01-12: qty 2

## 2023-01-12 MED ORDER — IOHEXOL 300 MG/ML  SOLN
100.0000 mL | Freq: Once | INTRAMUSCULAR | Status: AC | PRN
  Administered 2023-01-12: 100 mL via INTRAVENOUS

## 2023-01-12 MED ORDER — DROPERIDOL 2.5 MG/ML IJ SOLN
2.5000 mg | Freq: Once | INTRAMUSCULAR | Status: AC
  Administered 2023-01-12: 2.5 mg via INTRAVENOUS
  Filled 2023-01-12: qty 2

## 2023-01-12 NOTE — ED Notes (Signed)
Pt given heatpack because complaining his fingers were cold.

## 2023-01-12 NOTE — ED Notes (Signed)
ED Provider at bedside. 

## 2023-01-12 NOTE — Discharge Instructions (Addendum)
It was a pleasure taking part in your care today.  As we discussed, your lab work was largely unremarkable including the CT scan of your abdomen.  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  Due to this, please discontinue use of marijuana.  We also discussed your drinking today and revealed that you drink every day.  Please discontinue use of drinking.  Please follow-up with your PCP for reevaluation and further management.  Please return to the ED with any new or worsening signs or symptoms.  Please pick up medication that I have sent to your pharmacy on file to include Zofran for nausea and Bentyl for abdominal cramping.  Please see the attached work note.

## 2023-01-12 NOTE — ED Provider Notes (Signed)
[place] [facility] AT [facility] Provider Note   CSN: [id number] Arrival date & time: 01/12/23  4098     History  Chief Complaint  Patient presents with   Abdominal Pain    Noah Hardin is a 47 y.o. male with medical history of right-sided low back pain, lumbar strain, chronic left shoulder pain.  Patient presents to ED for evaluation of abdominal pain, nausea and vomiting.  The patient reports that his symptoms began last night.  Patient reports that he believes his symptoms may be secondary to alcohol use as he reports that he drinks every day and has done so for 5 years.  He states that he drinks 2-3 beers on the weekdays and a 12 pack on the weekends.  He reports that his last alcoholic beverage was yesterday, beer.  He also reports that he uses marijuana quite often but denies a history of cannabinoid hyperemesis syndrome.  Patient reports that nausea, vomiting and abdominal pain began last night and persisted into this morning.  He denies any fevers, diarrhea, blood in stool, blood in vomit, dysuria, flank pain, chest pain, body aches or chills.  He denies any known sick contacts.  He denies any recent changes to his diet.  Denies that anyone else in the household is currently experiencing similar symptoms.  He does endorse shortness of breath but states that it only occurs when he is throwing up.   Abdominal Pain Associated symptoms: nausea and vomiting   Associated symptoms: no chest pain, no chills, no diarrhea, no dysuria and no fever        Home Medications Prior to Admission medications   Medication Sig Start Date End Date Taking? Authorizing Provider  dicyclomine (BENTYL) 20 MG tablet Take 1 tablet (20 mg total) by mouth 2 (two) times daily. 01/12/23  Yes Al Decant, PA-C  ondansetron (ZOFRAN) 4 MG tablet Take 1 tablet (4 mg total) by mouth every 6 (six) hours as needed for nausea or vomiting. 01/12/23  Yes Al Decant, PA-C   diclofenac (VOLTAREN) 75 MG EC tablet Take 1 tablet (75 mg total) by mouth 2 (two) times daily. Patient not taking: Reported on 10/28/2022 10/14/22   Hannah Beat, MD      Allergies    Patient has no known allergies.    Review of Systems   Review of Systems  Constitutional:  Negative for chills and fever.  Cardiovascular:  Negative for chest pain.  Gastrointestinal:  Positive for abdominal pain, nausea and vomiting. Negative for blood in stool and diarrhea.  Genitourinary:  Negative for dysuria and flank pain.  Musculoskeletal:  Negative for myalgias.  All other systems reviewed and are negative.   Physical Exam Updated Vital Signs BP 136/88   Pulse 63   Ht 6\' 1"  (1.854 m)   Wt 68 kg   SpO2 100%   BMI 19.79 kg/m  Physical Exam Vitals and nursing note reviewed.  Constitutional:      General: He is not in acute distress.    Appearance: Normal appearance. He is not ill-appearing, toxic-appearing or diaphoretic.  HENT:     Head: Normocephalic and atraumatic.     Nose: Nose normal.     Mouth/Throat:     Mouth: Mucous membranes are moist.     Pharynx: Oropharynx is clear.  Eyes:     Extraocular Movements: Extraocular movements intact.     Conjunctiva/sclera: Conjunctivae normal.     Pupils: Pupils are equal, round, and reactive  to light.  Cardiovascular:     Rate and Rhythm: Normal rate and regular rhythm.  Pulmonary:     Effort: Pulmonary effort is normal.     Breath sounds: Normal breath sounds.  Abdominal:     General: Abdomen is flat. Bowel sounds are normal.     Palpations: Abdomen is soft.     Tenderness: There is abdominal tenderness. There is no right CVA tenderness or left CVA tenderness.     Comments: Generalized abdominal tenderness  Musculoskeletal:     Cervical back: Normal range of motion and neck supple. No tenderness.     Right lower leg: No edema.     Left lower leg: No edema.  Skin:    General: Skin is warm and dry.     Capillary Refill:  Capillary refill takes less than 2 seconds.  Neurological:     Mental Status: He is alert and oriented to person, place, and time.     ED Results / Procedures / Treatments   Labs (all labs ordered are listed, but only abnormal results are displayed) Labs Reviewed  COMPREHENSIVE METABOLIC PANEL - Abnormal; Notable for the following components:      Result Value   CO2 20 (*)    Glucose, Bld 150 (*)    All other components within normal limits  URINALYSIS, ROUTINE W REFLEX MICROSCOPIC - Abnormal; Notable for the following components:   pH 8.5 (*)    Ketones, ur 15 (*)    Protein, ur 30 (*)    All other components within normal limits  CBC WITH DIFFERENTIAL/PLATELET - Abnormal; Notable for the following components:   RBC 4.02 (*)    HCT 38.8 (*)    All other components within normal limits  RAPID URINE DRUG SCREEN, HOSP PERFORMED - Abnormal; Notable for the following components:   Opiates POSITIVE (*)    Tetrahydrocannabinol POSITIVE (*)    All other components within normal limits  URINALYSIS, MICROSCOPIC (REFLEX) - Abnormal; Notable for the following components:   Bacteria, UA RARE (*)    All other components within normal limits  LIPASE, BLOOD  ETHANOL  CBC WITH DIFFERENTIAL/PLATELET    EKG EKG Interpretation Date/Time:  Wednesday January 12 2023 13:42:40 EDT Ventricular Rate:  60 PR Interval:  189 QRS Duration:  93 QT Interval:  411 QTC Calculation: 411 R Axis:   77  Text Interpretation: Sinus rhythm Non-specific ST-t changes No previous tracing Early repolarization Confirmed by Cathren Laine ([id number]) on 01/12/2023 1:44:41 PM  Radiology CT ABDOMEN PELVIS W CONTRAST  Result Date: 01/12/2023 CLINICAL DATA:  Abdominal pain. EXAM: CT ABDOMEN AND PELVIS WITH CONTRAST TECHNIQUE: Multidetector CT imaging of the abdomen and pelvis was performed using the standard protocol following bolus administration of intravenous contrast. RADIATION DOSE REDUCTION: This exam was performed  according to the departmental dose-optimization program which includes automated exposure control, adjustment of the mA and/or kV according to patient size and/or use of iterative reconstruction technique. CONTRAST:  OMNIPAQUE IOHEXOL 300 MG/ML  SOLN COMPARISON:  10/11/2021 FINDINGS: Lower chest: Unremarkable. Hepatobiliary: No suspicious focal abnormality within the liver parenchyma. There is no evidence for gallstones, gallbladder wall thickening, or pericholecystic fluid. No intrahepatic or extrahepatic biliary dilation. Pancreas: No focal mass lesion. No dilatation of the main duct. No intraparenchymal cyst. No peripancreatic edema. Spleen: No splenomegaly. No suspicious focal mass lesion. Adrenals/Urinary Tract: No adrenal nodule or mass. Kidneys unremarkable. No evidence for hydroureter. The urinary bladder appears normal for the degree of distention. Stomach/Bowel:  Stomach is unremarkable. No gastric wall thickening. No evidence of outlet obstruction. Duodenum is normally positioned as is the ligament of Treitz. No small bowel wall thickening. No small bowel dilatation. The terminal ileum is normal. The appendix is normal. No gross colonic mass. No colonic wall thickening. Vascular/Lymphatic: No abdominal aortic aneurysm. No abdominal aortic atherosclerotic calcification. There is no gastrohepatic or hepatoduodenal ligament lymphadenopathy. No retroperitoneal or mesenteric lymphadenopathy. No pelvic sidewall lymphadenopathy. Reproductive: The prostate gland and seminal vesicles are unremarkable. Other: No intraperitoneal free fluid. Musculoskeletal: No worrisome lytic or sclerotic osseous abnormality. IMPRESSION: No acute findings in the abdomen or pelvis. Specifically, no findings to explain the patient's history of abdominal pain. Electronically Signed   By: Kennith Center M.D.   On: 01/12/2023 12:46   DG Chest Portable 1 View  Result Date: 01/12/2023 CLINICAL DATA:  shortness of breath when  vomiting EXAM: PORTABLE CHEST 1 VIEW COMPARISON:  None Available. FINDINGS: Bilateral lung fields are clear. Bilateral lateral costophrenic angles are clear. Normal cardio-mediastinal silhouette. No acute osseous abnormalities. The soft tissues are within normal limits. No free air under the domes of diaphragm. IMPRESSION: No active disease. Electronically Signed   By: Jules Schick M.D.   On: 01/12/2023 11:23    Procedures Procedures   Medications Ordered in ED Medications  sodium chloride 0.9 % bolus 1,000 mL (0 mLs Intravenous Stopped 01/12/23 1144)  ondansetron (ZOFRAN) injection 4 mg (4 mg Intravenous Given 01/12/23 1051)  morphine (PF) 4 MG/ML injection 4 mg (4 mg Intravenous Given 01/12/23 1052)  iohexol (OMNIPAQUE) 300 MG/ML solution 100 mL (100 mLs Intravenous Contrast Given 01/12/23 1225)  droperidol (INAPSINE) 2.5 MG/ML injection 2.5 mg (2.5 mg Intravenous Given 01/12/23 1345)  diphenhydrAMINE (BENADRYL) injection 12.5 mg (12.5 mg Intravenous Given 01/12/23 1344)    ED Course/ Medical Decision Making/ A&P  Medical Decision Making Amount and/or Complexity of Data Reviewed Labs: ordered.  Risk Prescription drug management.   47 year old male presents to ED for evaluation.  Please see HPI for further details.  On examination patient is afebrile and nontachycardic.  His lung sounds are clear bilaterally and he is not hypoxic on room air.  His abdomen has generalized abdominal tenderness throughout however no overlying skin change, no rebound or guarding.  Neurological examination at baseline without focal neurodeficits.  Patient initially given 1 L fluid, 4 mg of morphine, 4 mg Zofran.  CBC without leukocytosis or anemia.  Patient metabolic panel without electrolyte derangement, no elevated LFT, no elevated creatinine, no elevated anion gap.  Patient urinalysis shows elevated pH however negative for all other findings.  Patient rapid urine drug screen positive for THC and opiates.   Patient ethanol undetectable.  Patient not tachycardic or hypertensive so doubt alcohol withdrawal.  EtOH undetectable.  Chest x-ray unremarkable.  CT scan of patient abdomen and pelvis unremarkable.  On reassessment after medications given and imaging studies obtained, patient reports that his abdominal pain and nausea is continuing despite medications initially given.  At this time, 2.5 mg droperidol, 12.5 mg Benadryl were administered.  EKG was collected before hand which did not show any evidence of QTc prolongation.  After droperidol and Benadryl administered the patient reports that he feels much better and he is requesting discharge.  I explained to the patient that I believe that his symptoms are secondary to [facility] however he does not believe that this is the cause.  Patient could also be suffering from a viral GI bug however his girlfriend at bedside who he  spends most of his time with is asymptomatic.  At this time, patient will be sent home with Zofran and Bentyl.  He will be advised to follow-up with his PCP.  He was advised to discontinue alcohol use and marijuana use.  He was encouraged to hydrate himself at home and return to the ED with any new or worsening signs or symptoms.  He voiced understanding with instructions and he had all of his questions answered to his satisfaction.  He is stable to discharge home at this time.   Final Clinical Impression(s) / ED Diagnoses Final diagnoses:  Cannabinoid hyperemesis syndrome    Rx / DC Orders ED Discharge Orders          Ordered    ondansetron (ZOFRAN) 4 MG tablet  Every 6 hours PRN        01/12/23 1418    dicyclomine (BENTYL) 20 MG tablet  2 times daily        01/12/23 1418              Al Decant, [place] 01/12/23 1419    Cathren Laine, MD 01/13/23 1328

## 2023-01-12 NOTE — ED Triage Notes (Signed)
Abdominal pain with vomiting since yesterday.  Pt denies any diarrhea or fever.

## 2023-01-12 NOTE — ED Notes (Signed)
Labs redrawn

## 2023-01-12 NOTE — ED Notes (Signed)
Patient transported to CT 

## 2023-01-13 ENCOUNTER — Telehealth: Payer: Self-pay

## 2023-01-13 NOTE — Transitions of Care (Post Inpatient/ED Visit) (Unsigned)
   01/13/2023  Name: Noah Hardin MRN: [id number] DOB: [birth year]  Today's TOC FU Call Status: Today's TOC FU Call Status:: Unsuccessul Call (1st Attempt)  Attempted to reach the patient regarding the most recent Inpatient/ED visit.  Follow Up Plan: Additional outreach attempts will be made to reach the patient to complete the Transitions of Care (Post Inpatient/ED visit) call.   Signature  Woodfin Ganja LPN [facility] Nurse Health Advisor Direct Dial [phone]

## 2023-01-14 ENCOUNTER — Encounter: Payer: PRIVATE HEALTH INSURANCE | Admitting: Family Medicine

## 2023-01-17 NOTE — Transitions of Care (Post Inpatient/ED Visit) (Signed)
   01/17/2023  Name: Noah Hardin MRN: [id number] DOB: 1975-12-26  Today's TOC FU Call Status: Today's TOC FU Call Status:: Successful TOC FU Call Competed TOC FU Call Complete Date: 01/17/23  Transition Care Management Follow-up Telephone Call Date of Discharge: 01/12/23 Discharge Facility: [facility] Type of Discharge: Emergency Department Reason for ED Visit: Other: (Cannabinoid hyperemesis syndrome) How have you been since you were released from the hospital?: Better Any questions or concerns?: No  Items Reviewed: Did you receive and understand the discharge instructions provided?: Yes Medications obtained,verified, and reconciled?: Yes (Medications Reviewed) Any new allergies since your discharge?: No Dietary orders reviewed?: Yes Do you have support at home?: Yes  Medications Reviewed Today: Medications Reviewed Today     Reviewed by Merleen Nicely, LPN (Licensed Practical Nurse) on 01/17/23 at 1336  Med List Status: <None>   Medication Order Taking? Sig Documenting Provider Last Dose Status Informant  diclofenac (VOLTAREN) 75 MG EC tablet [id number] No Take 1 tablet (75 mg total) by mouth 2 (two) times daily.  Patient not taking: Reported on 01/17/2023   Hannah Beat, MD Not Taking Active   dicyclomine (BENTYL) 20 MG tablet [id number] Yes Take 1 tablet (20 mg total) by mouth 2 (two) times daily. Al Decant, PA-C Taking Active   ondansetron [facility]) 4 MG tablet [id number] Yes Take 1 tablet (4 mg total) by mouth every 6 (six) hours as needed for nausea or vomiting. Al Decant, PA-C Taking Active             Home Care and Equipment/Supplies: Were Home Health Services Ordered?: NA Any new equipment or medical supplies ordered?: NA  Functional Questionnaire: Do you need assistance with bathing/showering or dressing?: No Do you need assistance with meal preparation?: No Do you need assistance with eating?: No Do you have  difficulty maintaining continence: No Do you need assistance with getting out of bed/getting out of a chair/moving?: No Do you have difficulty managing or taking your medications?: No  Follow up appointments reviewed: PCP Follow-up appointment confirmed?: No MD Provider Line [phone] Given: Yes Specialist Hospital Follow-up appointment confirmed?: No Do you need transportation to your follow-up appointment?: No Do you understand care options if your condition(s) worsen?: Yes-patient verbalized understanding    SIGNATURE  Woodfin Ganja LPN [facility] Nurse Health Advisor Direct Dial [phone]

## 2023-01-27 ENCOUNTER — Ambulatory Visit (INDEPENDENT_AMBULATORY_CARE_PROVIDER_SITE_OTHER): Payer: BC Managed Care – PPO | Admitting: Nurse Practitioner

## 2023-01-27 ENCOUNTER — Encounter: Payer: Self-pay | Admitting: Nurse Practitioner

## 2023-01-27 VITALS — BP 124/78 | HR 88 | Temp 97.6°F | Ht 73.0 in | Wt 139.0 lb

## 2023-01-27 DIAGNOSIS — Z1211 Encounter for screening for malignant neoplasm of colon: Secondary | ICD-10-CM | POA: Diagnosis not present

## 2023-01-27 DIAGNOSIS — Z720 Tobacco use: Secondary | ICD-10-CM

## 2023-01-27 DIAGNOSIS — Z0000 Encounter for general adult medical examination without abnormal findings: Secondary | ICD-10-CM | POA: Diagnosis not present

## 2023-01-27 LAB — URINALYSIS, MICROSCOPIC ONLY: RBC / HPF: NONE SEEN (ref 0–?)

## 2023-01-27 LAB — CBC
HCT: 37.8 % — ABNORMAL LOW (ref 39.0–52.0)
Hemoglobin: 12.5 g/dL — ABNORMAL LOW (ref 13.0–17.0)
MCHC: 33.2 g/dL (ref 30.0–36.0)
MCV: 99.8 fl (ref 78.0–100.0)
Platelets: 318 10*3/uL (ref 150.0–400.0)
RBC: 3.79 Mil/uL — ABNORMAL LOW (ref 4.22–5.81)
RDW: 13.5 % (ref 11.5–15.5)
WBC: 5.7 10*3/uL (ref 4.0–10.5)

## 2023-01-27 LAB — COMPREHENSIVE METABOLIC PANEL
ALT: 14 U/L (ref 0–53)
AST: 22 U/L (ref 0–37)
Albumin: 4.3 g/dL (ref 3.5–5.2)
Alkaline Phosphatase: 75 U/L (ref 39–117)
BUN: 14 mg/dL (ref 6–23)
CO2: 27 mEq/L (ref 19–32)
Calcium: 9.9 mg/dL (ref 8.4–10.5)
Chloride: 107 mEq/L (ref 96–112)
Creatinine, Ser: 1.29 mg/dL (ref 0.40–1.50)
GFR: 66.06 mL/min (ref >60.00)
Glucose, Bld: 87 mg/dL (ref 70–99)
Potassium: 4.1 mEq/L (ref 3.5–5.1)
Sodium: 141 mEq/L (ref 135–145)
Total Bilirubin: 0.5 mg/dL (ref 0.2–1.2)
Total Protein: 6.8 g/dL (ref 6.0–8.3)

## 2023-01-27 LAB — TSH: TSH: 0.49 u[IU]/mL (ref 0.35–5.50)

## 2023-01-27 NOTE — Assessment & Plan Note (Signed)
Pending urine microscopy for microscopic hematuria.

## 2023-01-27 NOTE — Patient Instructions (Signed)
Nice to see you today I will be in touch with the labs once I have reviewed them Follow up with me in 1 year for your next physical, sooner if you need me

## 2023-01-27 NOTE — Assessment & Plan Note (Signed)
Discussed age-appropriate immunizations and screening exams.  Did review patient's personal, surgical, social, family histories.  Patient up-to-date on all age-appropriate vaccinations he would like.  Order placed for Cologuard for CRC screening.  Too young for PSA screening.  Patient was given information at discharge about preventative healthcare maintenance with anticipatory guidance.

## 2023-01-27 NOTE — Progress Notes (Signed)
Established Patient Office Visit  Subjective   Patient ID: Noah Hardin, male    DOB: May 18, 1976  Age: 47 y.o. MRN: [id number]  Chief Complaint  Patient presents with   Employment Physical    Fasting     HPI  for complete physical and follow up of chronic conditions.  Immunizations: -Tetanus: Completed in 2020 -Influenza: out of season -Shingles: Too young -Pneumonia: too young  Diet: Fair diet. States that he is eating more 2-3 times a day. States hot tea every other morning and water Exercise: No regular exercise. Sometimes he will work out 2 times a week  Eye exam: PRN Glasses   Dental exam:  up to date     Colonoscopy: Cologuard ordered today Lung Cancer Screening: N/A  PSA: Too young, currently average risk  Sleep: state sthat he will go to bed around 6am and will wake up around 11am. Does not snore     Review of Systems  Constitutional:  Negative for chills and fever.  Respiratory:  Negative for shortness of breath.   Cardiovascular:  Negative for chest pain and leg swelling.  Gastrointestinal:  Negative for abdominal pain, blood in stool, constipation, diarrhea, nausea and vomiting.       BM daily   Genitourinary:  Negative for dysuria and hematuria.  Neurological:  Negative for tingling and headaches.  Psychiatric/Behavioral:  Negative for hallucinations and suicidal ideas.       Objective:     BP 124/78   Pulse 88   Temp 97.6 F (36.4 C) (Temporal)   Ht 6\' 1"  (1.854 m)   Wt 139 lb (63 kg)   SpO2 98%   BMI 18.34 kg/m  BP Readings from Last 3 Encounters:  01/27/23 124/78  01/12/23 136/88  10/28/22 98/60   Wt Readings from Last 3 Encounters:  01/27/23 139 lb (63 kg)  01/12/23 150 lb (68 kg)  10/28/22 146 lb 4 oz (66.3 kg)      Physical Exam Vitals and nursing note reviewed. Exam conducted with a chaperone present Barnie Mort, CMA).  Constitutional:      Appearance: Normal appearance.  HENT:     Right Ear: Tympanic  membrane, ear canal and external ear normal.     Left Ear: Tympanic membrane, ear canal and external ear normal.     Mouth/Throat:     Mouth: Mucous membranes are moist.     Pharynx: Oropharynx is clear.  Eyes:     Extraocular Movements: Extraocular movements intact.     Pupils: Pupils are equal, round, and reactive to light.  Cardiovascular:     Rate and Rhythm: Normal rate and regular rhythm.     Pulses: Normal pulses.     Heart sounds: Normal heart sounds.  Pulmonary:     Effort: Pulmonary effort is normal.     Breath sounds: Normal breath sounds.  Abdominal:     General: Bowel sounds are normal. There is no distension.     Palpations: There is no mass.     Tenderness: There is no abdominal tenderness.     Hernia: No hernia is present. There is no hernia in the left inguinal area or right inguinal area.  Genitourinary:    Penis: Normal.      Testes: Normal.     Epididymis:     Right: Normal.     Left: Normal.  Musculoskeletal:     Right lower leg: No edema.     Left lower leg: No edema.  Lymphadenopathy:     Cervical: No cervical adenopathy.     Lower Body: No right inguinal adenopathy. No left inguinal adenopathy.  Skin:    General: Skin is warm.  Neurological:     General: No focal deficit present.     Mental Status: He is alert.     Deep Tendon Reflexes:     Reflex Scores:      Bicep reflexes are 2+ on the right side and 2+ on the left side.      Patellar reflexes are 2+ on the right side and 2+ on the left side.    Comments: Bilateral upper and lower extremity strength 5/5  Psychiatric:        Mood and Affect: Mood normal.        Behavior: Behavior normal.        Thought Content: Thought content normal.        Judgment: Judgment normal.      No results found for any visits on 01/27/23.    The 10-year ASCVD risk score (Arnett DK, et al., 2019) is: 6.3%    Assessment & Plan:   Problem List Items Addressed This Visit       Other   Tobacco use     Pending urine microscopy for microscopic hematuria      Relevant Orders   Urine Microscopic   Preventative health care - Primary    Discussed age-appropriate immunizations and screening exams.  Did review patient's personal, surgical, social, family histories.  Patient up-to-date on all age-appropriate vaccinations he would like.  Order placed for Cologuard for CRC screening.  Too young for PSA screening.  Patient was given information at discharge about preventative healthcare maintenance with anticipatory guidance.      Relevant Orders   CBC   Comprehensive metabolic panel   TSH   Other Visit Diagnoses     Screening for colon cancer       Relevant Orders   Cologuard       Return in about 1 year (around 01/27/2024) for CPE and Labs.    Audria Nine, NP

## 2023-01-31 ENCOUNTER — Encounter: Payer: PRIVATE HEALTH INSURANCE | Admitting: Nurse Practitioner

## 2023-04-24 IMAGING — CT CT ABD-PELV W/ CM
2 of 4 series · 16 of 46 positions shown, 18 images · IV contrast (agent unspecified)
Comparison: None.

CLINICAL DATA: Acute abdominal pain and vomiting beginning this
morning.

EXAM:
CT ABDOMEN AND PELVIS WITH CONTRAST
TECHNIQUE: Multidetector CT imaging of the abdomen and pelvis was performed
using the standard protocol following bolus administration of
intravenous contrast.

[Series 2: axial st · axial · 0.72mm/px · z∈[-448,-18]mm · 13 of 96 slices shown, 15 images]
[im 5/96  soft-tissue]
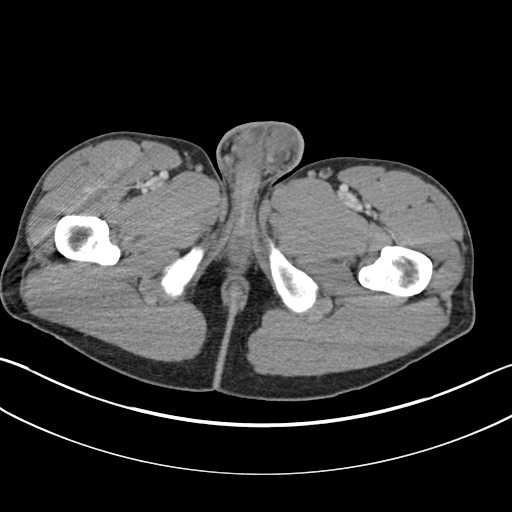
[im 5/96  bone]
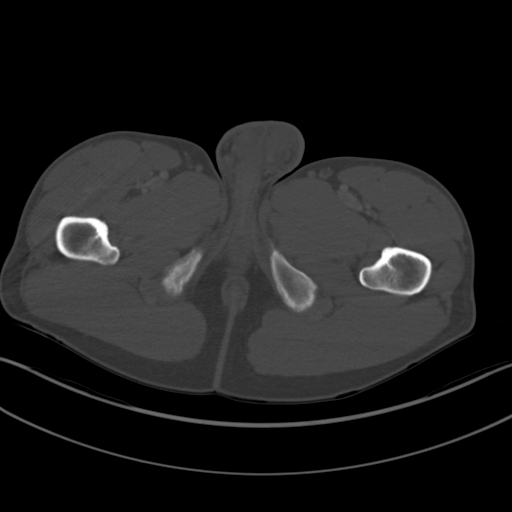
[im 13/96  soft-tissue]
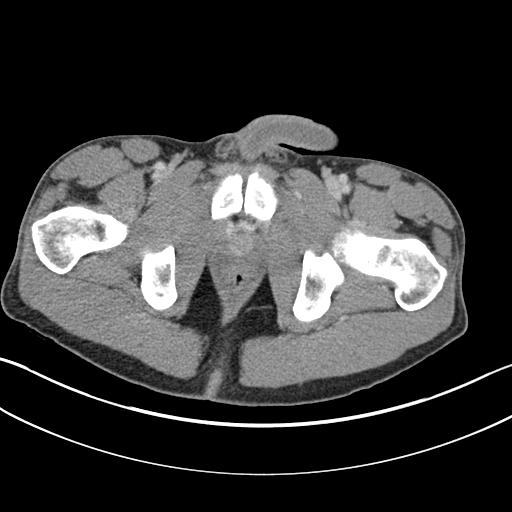
[im 21/96  soft-tissue]
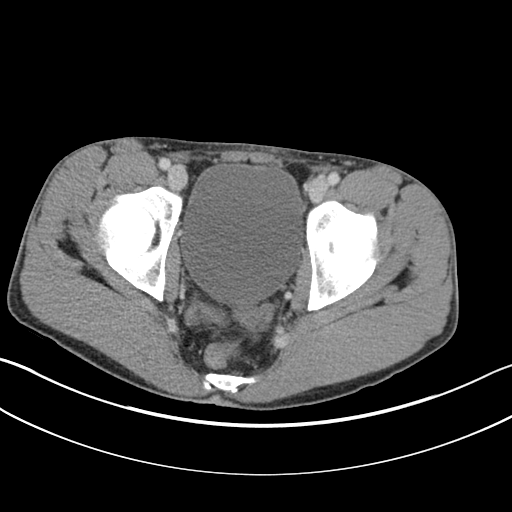
[im 25/96  soft-tissue]
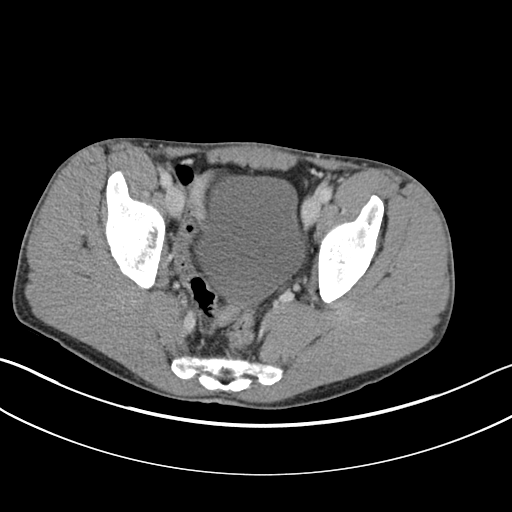
[im 34/96  soft-tissue]
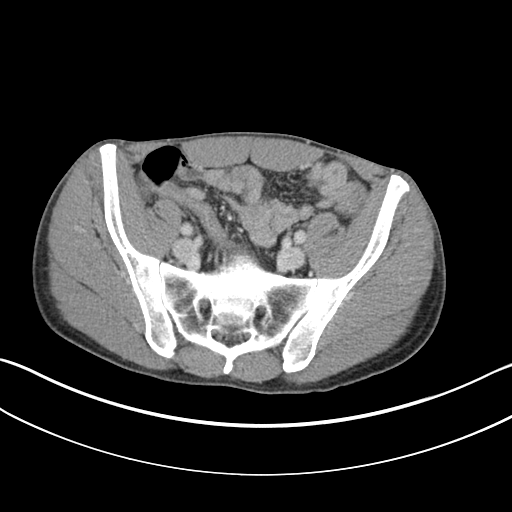
[im 42/96  soft-tissue]
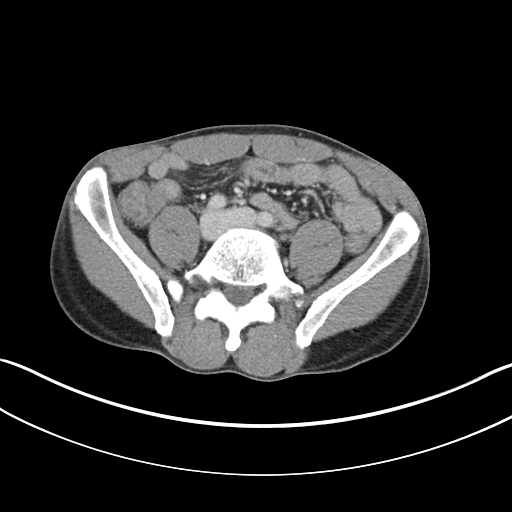
[im 50/96  soft-tissue]
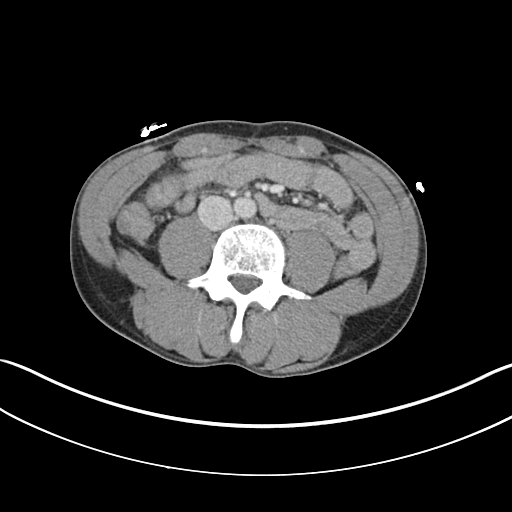
[im 54/96  soft-tissue]
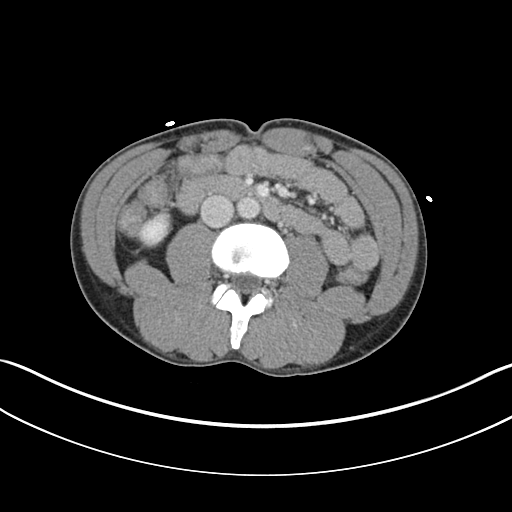
[im 62/96  soft-tissue]
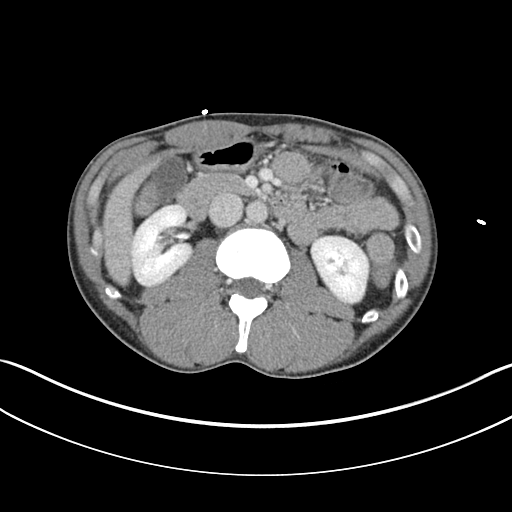
[im 62/96  bone]
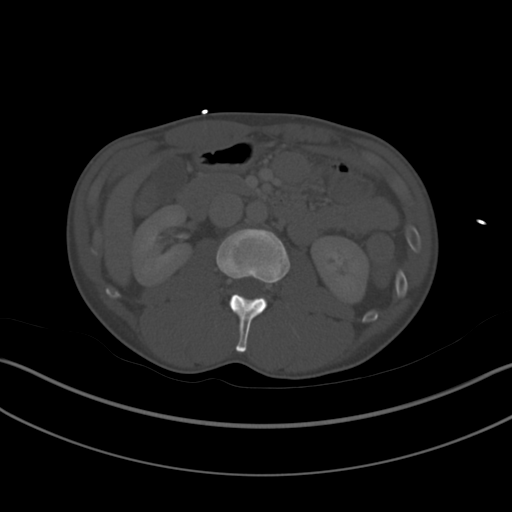
[im 71/96  soft-tissue]
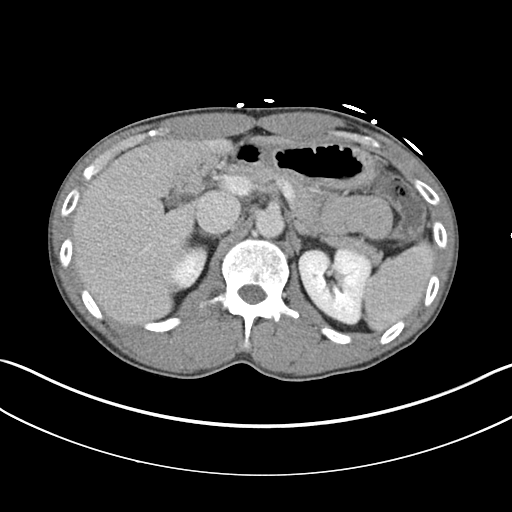
[im 75/96  soft-tissue]
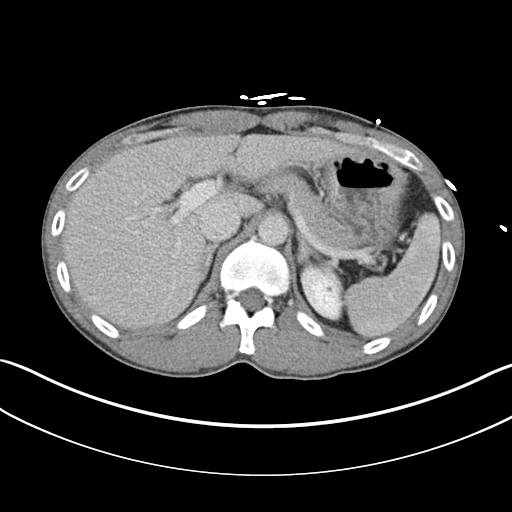
[im 83/96  soft-tissue]
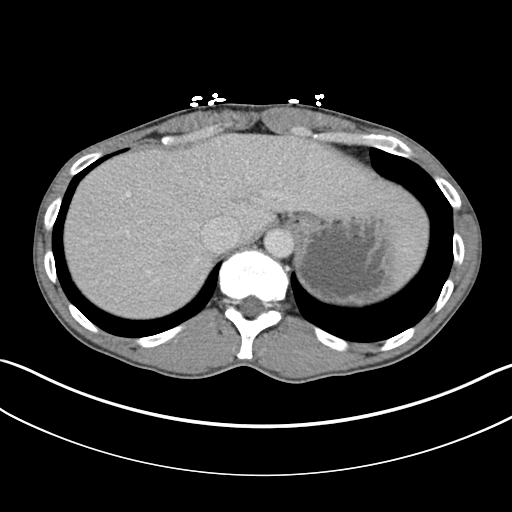
[im 91/96  soft-tissue]
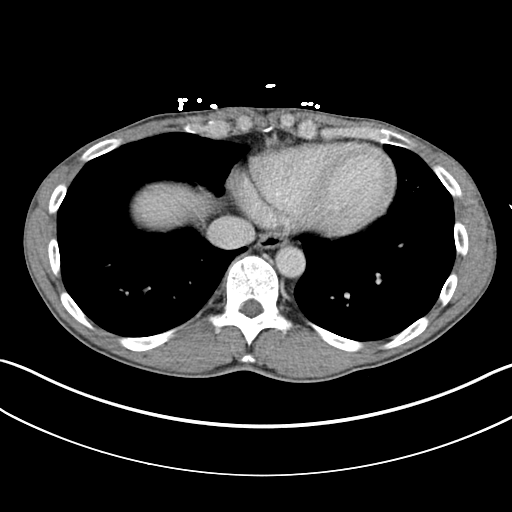

[Series 4: coronal st · coronal · 0.79mm/px · 3 of 117 slices shown]
[im 39/117  soft-tissue]
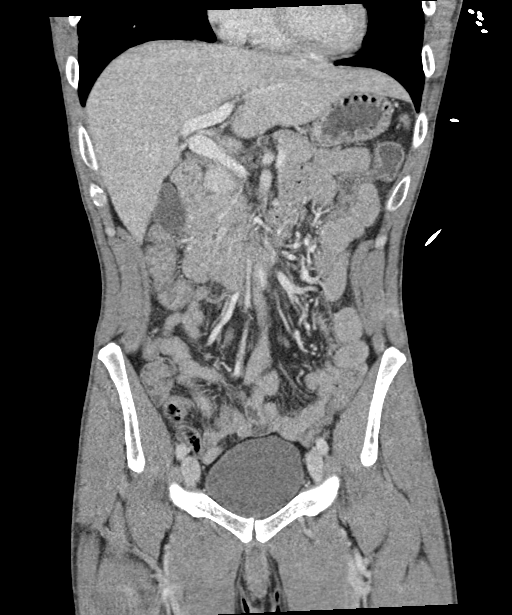
[im 52/117  soft-tissue]
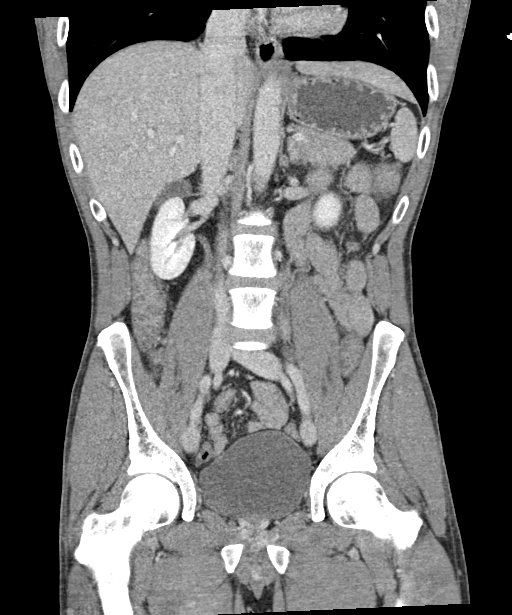
[im 65/117  soft-tissue]
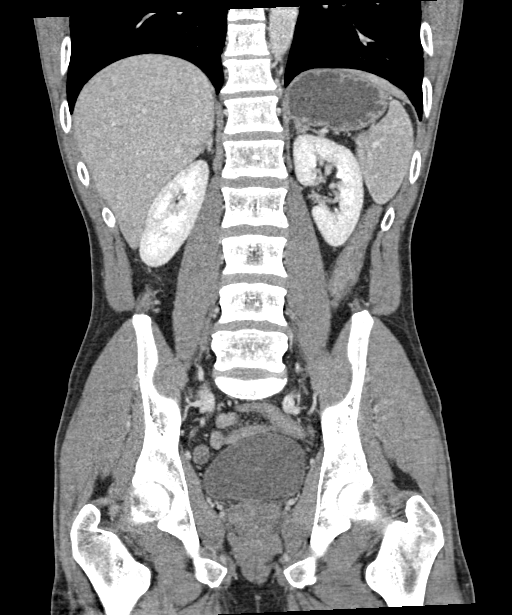

[16 of 46 positions shown; findings below may reference images not displayed]

RADIATION DOSE REDUCTION: This exam was performed according to the
departmental dose-optimization program which includes automated
exposure control, adjustment of the mA and/or kV according to
patient size and/or use of iterative reconstruction technique.

CONTRAST:  100mL OMNIPAQUE IOHEXOL 300 MG/ML  SOLN
FINDINGS: Lower Chest: No acute findings.

Hepatobiliary: No hepatic masses identified. Gallbladder is
unremarkable. No evidence of biliary ductal dilatation.

Pancreas:  No mass or inflammatory changes.

Spleen: Within normal limits in size and appearance.

Adrenals/Urinary Tract: No masses identified. No evidence of
ureteral calculi or hydronephrosis.

Stomach/Bowel: No evidence of obstruction, inflammatory process or
abnormal fluid collections. Normal appendix visualized.

Vascular/Lymphatic: No pathologically enlarged lymph nodes. No acute
vascular findings.

Reproductive:  No mass or other significant abnormality.

Other:  None.

Musculoskeletal:  No suspicious bone lesions identified.
IMPRESSION: Negative. No acute findings or other significant abnormality.

## 2023-07-26 ENCOUNTER — Other Ambulatory Visit: Payer: Self-pay

## 2023-07-26 ENCOUNTER — Encounter (HOSPITAL_COMMUNITY): Payer: Self-pay

## 2023-07-26 ENCOUNTER — Emergency Department (HOSPITAL_COMMUNITY)
Admission: EM | Admit: 2023-07-26 | Discharge: 2023-07-26 | Disposition: A | Discharge status: Home / Self Care | Payer: BC Managed Care – PPO | Attending: Emergency Medicine | Admitting: Emergency Medicine

## 2023-07-26 DIAGNOSIS — R1084 Generalized abdominal pain: Secondary | ICD-10-CM | POA: Insufficient documentation

## 2023-07-26 DIAGNOSIS — R110 Nausea: Secondary | ICD-10-CM | POA: Diagnosis not present

## 2023-07-26 LAB — COMPREHENSIVE METABOLIC PANEL WITH GFR
ALT: 13 U/L (ref 0–44)
AST: 21 U/L (ref 15–41)
Albumin: 4.1 g/dL (ref 3.5–5.0)
Alkaline Phosphatase: 60 U/L (ref 38–126)
Anion gap: 11 (ref 5–15)
BUN: 11 mg/dL (ref 6–20)
CO2: 25 mmol/L (ref 22–32)
Calcium: 9 mg/dL (ref 8.9–10.3)
Chloride: 101 mmol/L (ref 98–111)
Creatinine, Ser: 0.79 mg/dL (ref 0.61–1.24)
GFR, Estimated: >60 mL/min
Glucose, Bld: 95 mg/dL (ref 70–99)
Potassium: 3.7 mmol/L (ref 3.5–5.1)
Sodium: 137 mmol/L (ref 135–145)
Total Bilirubin: 0.7 mg/dL (ref 0.0–1.2)
Total Protein: 7.2 g/dL (ref 6.5–8.1)

## 2023-07-26 LAB — URINALYSIS, ROUTINE W REFLEX MICROSCOPIC
Bacteria, UA: NONE SEEN
Bilirubin Urine: NEGATIVE
Glucose, UA: NEGATIVE mg/dL
Hgb urine dipstick: NEGATIVE
Ketones, ur: NEGATIVE mg/dL
Leukocytes,Ua: NEGATIVE
Nitrite: NEGATIVE
Protein, ur: 30 mg/dL — AB
Specific Gravity, Urine: 1.026 (ref 1.005–1.030)
pH: 5 (ref 5.0–8.0)

## 2023-07-26 LAB — CBC
HCT: 39 % (ref 39.0–52.0)
Hemoglobin: 13.3 g/dL (ref 13.0–17.0)
MCH: 33.7 pg (ref 26.0–34.0)
MCHC: 34.1 g/dL (ref 30.0–36.0)
MCV: 98.7 fL (ref 80.0–100.0)
Platelets: 213 10*3/uL (ref 150–400)
RBC: 3.95 MIL/uL — ABNORMAL LOW (ref 4.22–5.81)
RDW: 12.2 % (ref 11.5–15.5)
WBC: 4.3 10*3/uL (ref 4.0–10.5)
nRBC: 0 % (ref 0.0–0.2)

## 2023-07-26 LAB — LIPASE, BLOOD: Lipase: 24 U/L (ref 11–51)

## 2023-07-26 MED ORDER — ONDANSETRON HCL 4 MG PO TABS: 4.0000 mg | ORAL_TABLET | Freq: Three times a day (TID) | ORAL | 0 refills | Status: AC | PRN

## 2023-07-26 NOTE — Discharge Instructions (Signed)
Your labs are overall reassuring.  However, your condition may be due to marijuana use.  Avoid marijuana as it may triggers more pain.  Return if you have any concern.

## 2023-07-26 NOTE — ED Provider Triage Note (Signed)
Emergency Medicine Provider Triage Evaluation Note  Noah Hardin , a 48 y.o. male  was evaluated in triage.  Pt complains of dull ache epigastric pain for 3 weeks.  Review of Systems  Positive: NV Negative: D, fever  Physical Exam  BP 118/83 (BP Location: Left Arm)   Pulse 83   Temp 98.5 F (36.9 C) (Oral)   Resp 16   Ht 6\' 1"  (1.854 m)   Wt 63 kg   SpO2 100%   BMI 18.32 kg/m  Gen:   Awake, no distress   Resp:  Normal effort  MSK:   Moves extremities without difficulty  Other:  Mild epigastric pain  Medical Decision Making  Medically screening exam initiated at 11:30 AM.  Appropriate orders placed.  [place] Sloop was informed that the remainder of the evaluation will be completed by another provider, this initial triage assessment does not replace that evaluation, and the importance of remaining in the ED until their evaluation is complete.  No distress No change in BM No blood in urine or stool No prior abdominal surgery  Has been seen here for similar, admits to occasional drug and EtOH   Kimia Finan, Horald Chestnut, PA-C 07/26/23 1131

## 2023-07-26 NOTE — ED Provider Notes (Signed)
[place] EMERGENCY DEPARTMENT AT [facility] Provider Note   CSN: [id number] Arrival date & time: 07/26/23  1013     History  Chief Complaint  Patient presents with   Abdominal Pain    Noah Hardin is a 48 y.o. male.  The history is provided by the patient and medical records. No language interpreter was used.  Abdominal Pain    48 year old male history of alcohol and marijuana use presenting with complaint of abdominal pain.  Patient endorsed pain across his abdomen that has been waxing waning ongoing for the past 4 days.  He also endorsed having bouts of nausea without vomiting.  He noticed some improvement of symptoms with hot showers and bath.  He mention he used alcohol and marijuana but have not used it for the past 3 weeks.  He denies any fever or chills no chest pain no shortness of breath no runny nose sneezing or coughing no dysuria or hematuria and no rash.  No treatment tried.  Home Medications Prior to Admission medications   Not on File      Allergies    Patient has no known allergies.    Review of Systems   Review of Systems  Gastrointestinal:  Positive for abdominal pain.  All other systems reviewed and are negative.   Physical Exam Updated Vital Signs BP 118/83 (BP Location: Left Arm)   Pulse 83   Temp 98.5 F (36.9 C) (Oral)   Resp 16   Ht 6\' 1"  (1.854 m)   Wt 63 kg   SpO2 100%   BMI 18.32 kg/m  Physical Exam Vitals and nursing note reviewed.  Constitutional:      General: He is not in acute distress.    Appearance: He is well-developed.  HENT:     Head: Atraumatic.  Eyes:     Conjunctiva/sclera: Conjunctivae normal.  Cardiovascular:     Rate and Rhythm: Normal rate and regular rhythm.  Pulmonary:     Effort: Pulmonary effort is normal.     Breath sounds: Normal breath sounds.  Abdominal:     General: Bowel sounds are normal.     Palpations: Abdomen is soft.     Tenderness: There is no abdominal tenderness.   Musculoskeletal:     Cervical back: Neck supple.  Skin:    Findings: No rash.  Neurological:     Mental Status: He is alert.     ED Results / Procedures / Treatments   Labs (all labs ordered are listed, but only abnormal results are displayed) Labs Reviewed  CBC - Abnormal; Notable for the following components:      Result Value   RBC 3.95 (*)    All other components within normal limits  URINALYSIS, ROUTINE W REFLEX MICROSCOPIC - Abnormal; Notable for the following components:   Protein, ur 30 (*)    All other components within normal limits  LIPASE, BLOOD  COMPREHENSIVE METABOLIC PANEL    EKG None  Radiology No results found.  Procedures Procedures    Medications Ordered in ED Medications - No data to display  ED Course/ Medical Decision Making/ A&P                                 Medical Decision Making Amount and/or Complexity of Data Reviewed Labs: ordered.   BP 118/83 (BP Location: Left Arm)   Pulse 83   Temp 98.5 F (36.9 C) (  Oral)   Resp 16   Ht 6\' 1"  (1.854 m)   Wt 63 kg   SpO2 100%   BMI 18.32 kg/m   81:19 PM  48 year old male history of alcohol and marijuana use presenting with complaint of abdominal pain.  Patient endorsed pain across his abdomen that has been waxing waning ongoing for the past 4 days.  He also endorsed having bouts of nausea without vomiting.  He noticed some improvement of symptoms with hot showers and bath.  He mention he used alcohol and marijuana but have not used it for the past 3 weeks.  He denies any fever or chills no chest pain no shortness of breath no runny nose sneezing or coughing no dysuria or hematuria and no rash.  No treatment tried.  Exam overall reassuring no reproducible abdominal tenderness no guarding no rebound tenderness.  Negative Murphy sign no pain McBurney's point.  Heart with normal rate and rhythm, lungs clear to auscultation bilaterally no CVA tenderness.  -Labs ordered, independently viewed  and interpreted by me.  Labs remarkable for reassuring lab values -The patient was maintained on a cardiac monitor.  I personally viewed and interpreted the cardiac monitored which showed an underlying rhythm of: NSR -Imaging including abd/pelvis CT considered but with minimal abdominal tenderness on exam I have low suspicion for any acute abdominal pathology. -This patient presents to the ED for concern of abdominal pain, this involves an extensive number of treatment options, and is a complaint that carries with it a high risk of complications and morbidity.  The differential diagnosis includes cannabinoid hyperemesis syndrome, colitis, diverticulitis, GERD, cholecystitis, gastritis, pancreatitis, appendicitis -Co morbidities that complicate the patient evaluation includes marijuana use, alcohol use -Treatment includes reassurance -Reevaluation of the patient after these medicines showed that the patient improved -PCP office notes or outside notes reviewed -Escalation to admission/observation considered: patients feels much better, is comfortable with discharge, and will follow up with PCP -Prescription medication considered, patient comfortable with Zofran as needed for nausea -Social Determinant of Health considered which includes marijuana use and alcohol use.  I suggest patient to avoid marijuana use as a symptom to be due to cannabinoid hyperemesis syndrome.  Return precaution given.           Final Clinical Impression(s) / ED Diagnoses Final diagnoses:  Generalized abdominal pain    Rx / DC Orders ED Discharge Orders          Ordered    ondansetron (ZOFRAN) 4 MG tablet  Every 8 hours PRN        07/26/23 1502              Fayrene Helper, PA-C 07/26/23 1503    Bethann Berkshire, MD 07/28/23 [phone]

## 2023-07-26 NOTE — ED Triage Notes (Signed)
C/o upper abd pain x3 weeks with headache that started 2 days ago.  Denies n/v/d.  Denies urinary s/sy Pt reports usually uses ETOH and marijuana but hasn't in 3 week since pain started.

## 2023-11-07 ENCOUNTER — Encounter: Payer: Self-pay | Admitting: Family

## 2023-11-07 ENCOUNTER — Encounter: Payer: Self-pay | Admitting: Nurse Practitioner

## 2023-11-07 ENCOUNTER — Ambulatory Visit (INDEPENDENT_AMBULATORY_CARE_PROVIDER_SITE_OTHER): Payer: PRIVATE HEALTH INSURANCE | Admitting: Family

## 2023-11-07 VITALS — BP 110/68 | HR 74 | Temp 98.0°F | Ht 73.0 in | Wt 130.0 lb

## 2023-11-07 DIAGNOSIS — R739 Hyperglycemia, unspecified: Secondary | ICD-10-CM

## 2023-11-07 DIAGNOSIS — R634 Abnormal weight loss: Secondary | ICD-10-CM

## 2023-11-07 DIAGNOSIS — R112 Nausea with vomiting, unspecified: Secondary | ICD-10-CM

## 2023-11-07 DIAGNOSIS — R12 Heartburn: Secondary | ICD-10-CM

## 2023-11-07 DIAGNOSIS — K529 Noninfective gastroenteritis and colitis, unspecified: Secondary | ICD-10-CM | POA: Diagnosis not present

## 2023-11-07 DIAGNOSIS — K5909 Other constipation: Secondary | ICD-10-CM

## 2023-11-07 DIAGNOSIS — R82998 Other abnormal findings in urine: Secondary | ICD-10-CM

## 2023-11-07 MED ORDER — AZITHROMYCIN 250 MG PO TABS
ORAL_TABLET | ORAL | 0 refills | Status: AC
Start: 2023-11-07 — End: 2023-11-12

## 2023-11-07 MED ORDER — OMEPRAZOLE 20 MG PO CPDR
20.0000 mg | DELAYED_RELEASE_CAPSULE | Freq: Every day | ORAL | 0 refills | Status: AC
Start: 2023-11-07 — End: ?

## 2023-11-07 NOTE — Assessment & Plan Note (Signed)
 Repeat urine today order culture

## 2023-11-07 NOTE — Addendum Note (Signed)
 Addended by: Felicita Horns on: 11/07/2023 02:13 PM   Modules accepted: Orders

## 2023-11-07 NOTE — Assessment & Plan Note (Addendum)
 Thyroid  panel to r/o thyroid  disease Hiv test to r/o  Once feeling better goal is to gain weight, high cal high protein diet recommended.

## 2023-11-07 NOTE — Patient Instructions (Addendum)
   Your imaging for abdominal ultrasound  Has been ordered at the following location.  Please call to schedule a time and date that would work for you.    [address], [place] Phone [phone],  8-430 pm    ------------------------------------

## 2023-11-07 NOTE — Assessment & Plan Note (Addendum)
 Suspected gastroenteritis  Rx zpack (as with recent travel as well) Zofran  prn  Bland diet as tolerated Increase fluids as able.  H pylori today  Trial rx omeprazole 20 mg once daily Can not obtain stool sample, pt constipated. Will hold on miralax currently as without pain however can consider if needed (worry is for more GI upset) Advised to continue to stay off of alcohol and also marijuana use could be cyclic vomiting syndrome potentially

## 2023-11-07 NOTE — Progress Notes (Signed)
 Established Patient Office Visit  Subjective:   Patient ID: Noah Hardin, male    DOB: [birth year]  Age: 48 y.o. MRN: [id number]  CC:  Chief Complaint  Patient presents with   Acute Visit    Reports abdominal pain and nausea    HPI: Noah Hardin is a 48 y.o. male presenting on 11/07/2023 for Acute Visit (Reports abdominal pain and nausea)  Here with c/o nausea and is vomiting, 'can't keep anything down at all in his stomach' . Sx started 8 days ago. He states it started to get better and tried to go in to work Wednesday but then had to pull over on the side of the road. He was having normal bowel movements and then three days ago started with constipation. He has taken some pepto bismal which helped x one but then not since. Has tried zofran  without any relief of symptoms. He does state he has been eating out a lot and is getting fast food, he does note some heart burn.   He tried to eat eggs and crackers and was able to keep this down, and tried to cook some food later in the afternoon but this only made the nausea worse.   He hasn't eaten beef or pork for over 10 years.   He did go to the ER 11/03/23 and was given IVF with some improvement. He was using marijuana daily but he stopped this week, also was given zofran  but states doesn't really help. He was drinking alcohol a few beers a day.   [place]  Wt Readings from Last 3 Encounters:  11/07/23 130 lb (59 kg)  07/26/23 138 lb 14.2 oz (63 kg)  01/27/23 139 lb (63 kg)      ROS: Negative unless specifically indicated above in HPI.   Relevant past medical history reviewed and updated as indicated.   Allergies and medications reviewed and updated.   Current Outpatient Medications:    azithromycin (ZITHROMAX) 250 MG tablet, Take 2 tablets on day 1, then 1 tablet daily on days 2 through 5, Disp: 6 tablet, Rfl: 0   omeprazole (PRILOSEC) 20 MG capsule, Take 1 capsule (20 mg total) by mouth  daily., Disp: 30 capsule, Rfl: 0   ondansetron  (ZOFRAN ) 4 MG tablet, Take 1 tablet (4 mg total) by mouth every 8 (eight) hours as needed for vomiting or nausea., Disp: 12 tablet, Rfl: 0  No Known Allergies  Objective:   BP 110/68 (BP Location: Right Arm, Patient Position: Sitting, Cuff Size: Normal)   Pulse 74   Temp 98 F (36.7 C) (Temporal)   Ht 6\' 1"  (1.854 m)   Wt 130 lb (59 kg)   SpO2 98%   BMI 17.15 kg/m    Physical Exam Vitals reviewed.  Constitutional:      General: He is not in acute distress.    Appearance: Normal appearance. He is normal weight. He is not ill-appearing, toxic-appearing or diaphoretic.  Cardiovascular:     Rate and Rhythm: Normal rate.  Pulmonary:     Effort: Pulmonary effort is normal.  Abdominal:     General: Bowel sounds are decreased.     Palpations: Abdomen is soft.     Tenderness: There is abdominal tenderness in the right upper quadrant and epigastric area.  Musculoskeletal:        General: Normal range of motion.  Neurological:     General: No focal deficit present.     Mental Status:  He is alert and oriented to person, place, and time. Mental status is at baseline.  Psychiatric:        Mood and Affect: Mood normal.        Behavior: Behavior normal.        Thought Content: Thought content normal.        Judgment: Judgment normal.     Assessment & Plan:  Other constipation -     US  Abdomen Complete; Future  Nausea and vomiting, unspecified vomiting type Assessment & Plan: Suspected gastroenteritis  Rx zpack (as with recent travel as well) Zofran  prn  Bland diet as tolerated Increase fluids as able.  H pylori today  Trial rx omeprazole 20 mg once daily Can not obtain stool sample, pt constipated. Will hold on miralax currently as without pain however can consider if needed (worry is for more GI upset) Advised to continue to stay off of alcohol and also marijuana use could be cyclic vomiting syndrome potentially   Orders: -      US  Abdomen Complete; Future  Gastroenteritis -     CBC with Differential/Platelet -     Comprehensive metabolic panel with GFR -     Azithromycin; Take 2 tablets on day 1, then 1 tablet daily on days 2 through 5  Dispense: 6 tablet; Refill: 0  Abnormal weight loss Assessment & Plan: Thyroid  panel to r/o thyroid  disease Hiv test to r/o  Once feeling better goal is to gain weight, high cal high protein diet recommended.   Orders: -     TSH -     HIV Antibody (routine testing w rflx) -     US  Abdomen Complete; Future -     T3, free  Heartburn -     H. pylori breath test -     Omeprazole; Take 1 capsule (20 mg total) by mouth daily.  Dispense: 30 capsule; Refill: 0  Leukocytes in urine Assessment & Plan: Repeat urine today order culture  Orders: -     Comprehensive metabolic panel with GFR -     Urinalysis w microscopic + reflex cultur  Hyperglycemia -     Hemoglobin A1c     Follow up plan: Return for f/u PCP if no improvement in symptoms.  Noah Horns, FNP

## 2023-11-08 ENCOUNTER — Encounter: Payer: Self-pay | Admitting: Family

## 2023-11-08 ENCOUNTER — Ambulatory Visit: Payer: Self-pay | Admitting: Family

## 2023-11-08 DIAGNOSIS — A048 Other specified bacterial intestinal infections: Secondary | ICD-10-CM

## 2023-11-08 LAB — COMPREHENSIVE METABOLIC PANEL WITH GFR
ALT: 12 U/L (ref 0–53)
AST: 15 U/L (ref 0–37)
Albumin: 4.8 g/dL (ref 3.5–5.2)
Alkaline Phosphatase: 59 U/L (ref 39–117)
BUN: 17 mg/dL (ref 6–23)
CO2: 31 meq/L (ref 19–32)
Calcium: 10.1 mg/dL (ref 8.4–10.5)
Chloride: 99 meq/L (ref 96–112)
Creatinine, Ser: 1.35 mg/dL (ref 0.40–1.50)
GFR: 62.22 mL/min (ref >60.00)
Glucose, Bld: 96 mg/dL (ref 70–99)
Potassium: 3.9 meq/L (ref 3.5–5.1)
Sodium: 139 meq/L (ref 135–145)
Total Bilirubin: 1.3 mg/dL — ABNORMAL HIGH (ref 0.2–1.2)
Total Protein: 7.2 g/dL (ref 6.0–8.3)

## 2023-11-08 LAB — CBC WITH DIFFERENTIAL/PLATELET
Basophils Absolute: 0.1 10*3/uL (ref 0.0–0.1)
Basophils Relative: 1 % (ref 0.0–3.0)
Eosinophils Absolute: 0.1 10*3/uL (ref 0.0–0.7)
Eosinophils Relative: 1.8 % (ref 0.0–5.0)
HCT: 44 % (ref 39.0–52.0)
Hemoglobin: 14.7 g/dL (ref 13.0–17.0)
Lymphocytes Relative: 39.5 % (ref 12.0–46.0)
Lymphs Abs: 2.2 10*3/uL (ref 0.7–4.0)
MCHC: 33.4 g/dL (ref 30.0–36.0)
MCV: 98.2 fl (ref 78.0–100.0)
Monocytes Absolute: 0.7 10*3/uL (ref 0.1–1.0)
Monocytes Relative: 12.5 % — ABNORMAL HIGH (ref 3.0–12.0)
Neutro Abs: 2.6 10*3/uL (ref 1.4–7.7)
Neutrophils Relative %: 45.2 % (ref 43.0–77.0)
Platelets: 363 10*3/uL (ref 150.0–400.0)
RBC: 4.48 Mil/uL (ref 4.22–5.81)
RDW: 12.8 % (ref 11.5–15.5)
WBC: 5.7 10*3/uL (ref 4.0–10.5)

## 2023-11-08 LAB — T3, FREE: T3, Free: 2.6 pg/mL (ref 2.3–4.2)

## 2023-11-08 LAB — TSH: TSH: 0.78 u[IU]/mL (ref 0.35–5.50)

## 2023-11-08 LAB — HEMOGLOBIN A1C: Hgb A1c MFr Bld: 5.2 % (ref 4.6–6.5)

## 2023-11-09 LAB — URINALYSIS W MICROSCOPIC + REFLEX CULTURE
Bacteria, UA: NONE SEEN /HPF
Bilirubin Urine: NEGATIVE
Glucose, UA: NEGATIVE
Hgb urine dipstick: NEGATIVE
Nitrites, Initial: NEGATIVE
RBC / HPF: NONE SEEN /HPF (ref 0–2)
Specific Gravity, Urine: 1.041 — ABNORMAL HIGH (ref 1.001–1.035)
pH: 6 (ref 5.0–8.0)

## 2023-11-09 LAB — HEPATITIS PANEL, ACUTE
Hep A IgM: NONREACTIVE
Hep B C IgM: NONREACTIVE
Hepatitis B Surface Ag: NONREACTIVE
Hepatitis C Ab: NONREACTIVE

## 2023-11-09 LAB — URINE CULTURE
MICRO NUMBER:: 16445886
Result:: NO GROWTH
SPECIMEN QUALITY:: ADEQUATE

## 2023-11-09 LAB — HIV ANTIBODY (ROUTINE TESTING W REFLEX): HIV 1&2 Ab, 4th Generation: NONREACTIVE

## 2023-11-09 LAB — TEST AUTHORIZATION

## 2023-11-09 LAB — CULTURE INDICATED

## 2023-11-09 LAB — H. PYLORI BREATH TEST: H. pylori Breath Test: DETECTED — AB

## 2023-11-09 NOTE — Telephone Encounter (Signed)
 Patient is calling in left his prescription home the medication is working he has two more days left of the medication but wanted to see if a prescription can be called in for two days for the [facility] in [place]  [address] patient would like a call back to see if he can have that done

## 2023-11-10 MED ORDER — AZITHROMYCIN 250 MG PO TABS: 250.0000 mg | ORAL_TABLET | Freq: Every day | ORAL | 0 refills | Status: AC

## 2023-11-13 MED ORDER — CLARITHROMYCIN 500 MG PO TABS: 500.0000 mg | ORAL_TABLET | Freq: Two times a day (BID) | ORAL | 0 refills | Status: AC

## 2023-11-13 MED ORDER — METRONIDAZOLE 500 MG PO TABS: 500.0000 mg | ORAL_TABLET | Freq: Two times a day (BID) | ORAL | 0 refills | Status: AC

## 2023-11-13 MED ORDER — AMOXICILLIN 500 MG PO CAPS: 1000.0000 mg | ORAL_CAPSULE | Freq: Two times a day (BID) | ORAL | 0 refills | Status: AC

## 2023-11-13 MED ORDER — PANTOPRAZOLE SODIUM 40 MG PO TBEC: 40.0000 mg | DELAYED_RELEASE_TABLET | Freq: Two times a day (BID) | ORAL | 0 refills | Status: AC

## 2024-01-30 ENCOUNTER — Encounter: Payer: PRIVATE HEALTH INSURANCE | Admitting: Nurse Practitioner

## 2024-01-30 NOTE — Progress Notes (Deleted)
   Established Patient Office Visit  Subjective   Patient ID: Evin Loiseau, male    DOB: Jul 01, 1975  Age: 48 y.o. MRN: [id number]  No chief complaint on file.   HPI   GERD:  for complete physical and follow up of chronic conditions.  Immunizations: -Tetanus: Completed in 2020 -Influenza: Out of season -Shingles: Too young -Pneumonia: Needs Prevnar 20 due to smoking  Diet: Fair diet.  Exercise: No regular exercise.  Eye exam: Completes annually  Dental exam: Completes semi-annually    Colonoscopy: Needs updating Lung Cancer Screening: Completed in   PSA: Due  Sleep:    {History (Optional):23778}  ROS    Objective:     There were no vitals taken for this visit. {Vitals History (Optional):23777}  Physical Exam   No results found for any visits on 01/30/24.  {Labs (Optional):23779}  The ASCVD Risk score (Arnett DK, et al., 2019) failed to calculate for the following reasons:   Cannot find a previous HDL lab   Cannot find a previous total cholesterol lab    Assessment & Plan:   Problem List Items Addressed This Visit   None   No follow-ups on file.    Adina Crandall, NP

## 2024-01-31 ENCOUNTER — Encounter: Payer: Self-pay | Admitting: *Deleted
